# Patient Record
Sex: Female | Born: 1990 | Race: White | Hispanic: No | Marital: Married | Smoking: Never smoker
Health system: Southern US, Community
[De-identification: ages and names within clinical notes are randomized; demographics above are authoritative.]

## PROBLEM LIST (undated history)

## (undated) DIAGNOSIS — R519 Headache, unspecified: Secondary | ICD-10-CM

## (undated) DIAGNOSIS — E785 Hyperlipidemia, unspecified: Secondary | ICD-10-CM

## (undated) DIAGNOSIS — Z87440 Personal history of urinary (tract) infections: Secondary | ICD-10-CM

## (undated) DIAGNOSIS — G90A Postural orthostatic tachycardia syndrome (POTS): Secondary | ICD-10-CM

## (undated) DIAGNOSIS — R51 Headache: Secondary | ICD-10-CM

## (undated) DIAGNOSIS — D649 Anemia, unspecified: Secondary | ICD-10-CM

## (undated) DIAGNOSIS — B019 Varicella without complication: Secondary | ICD-10-CM

## (undated) HISTORY — DX: Personal history of urinary (tract) infections: Z87.440

## (undated) HISTORY — DX: Headache, unspecified: R51.9

## (undated) HISTORY — DX: Hyperlipidemia, unspecified: E78.5

## (undated) HISTORY — DX: Varicella without complication: B01.9

## (undated) HISTORY — PX: OTHER SURGICAL HISTORY: SHX169

## (undated) HISTORY — DX: Postural orthostatic tachycardia syndrome (POTS): G90.A

## (undated) HISTORY — DX: Anemia, unspecified: D64.9

## (undated) HISTORY — DX: Headache: R51

---
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
## 2011-01-02 NOTE — H&P (Signed)
Janice Vance, Janice Vance             ACCOUNT NO.:  [id number]   MEDICAL RECORD NO.:  [id number]          PATIENT TYPE:  INP   LOCATION:  0106                          FACILITY:  [facility]   PHYSICIAN:  Lalla Brothers, MDDATE OF BIRTH:  1991-06-29   DATE OF ADMISSION:  01/06/2007  DATE OF DISCHARGE:                       PSYCHIATRIC ADMISSION ASSESSMENT   IDENTIFICATION:  This 20 year old female, who graduated Dec 26, 2006 from  [employer], an independent home-schooling institution, is admitted  emergently voluntarily in transfer from [facility] Int[facility] Care  Unit by [facility] for inpatient stabilization and treatment of suicide  attempt and depression.  They advised the referring institution that the  family and the patient would likely be ambivalent about cooperating with  care.  They referred the patient voluntarily despite their firsthand  access to the consequences of the patient's overdose reportedly with  #109 Tylenol tablets though father reduces it to approximately 50  tablets by the time of arrival.  Father maintains the patient was simply  stressed by breakup with a boyfriend of 11 months while mother is aware  the patient's nearly lifelong low self-esteem, low confidence and  depressive anxiety.  The patient has been under the outpatient care of  Dr. Tiajuana Amass for Effexor treatment of depression, now being  switched to Lamictal and sees Jerl Mina for psychotherapy at  [facility].   HISTORY OF PRESENT ILLNESS:  The patient will give no details while the  family gradually clarifies that the patient took her overdose in at  least three waves of handfuls of pills while her ex-boyfriend was  standing there observing what he considered stupid behavior.  Boyfriend  would tell her that the pills would not harm her and the patient would  continue to take them as he was leaving to go to his new girlfriend's  house, calling the new girlfriend from  the patient's house.  The  patient's Tylenol level was 192.5 at two hours and 142.3 at four hours,  potentially toxic, being documented to drop to less than 10 as she  received a full course of oral Mucomyst detoxification.  The patient has  been tapering off of Effexor, now at 37.5 mg XR every morning,  apparently over the last two weeks, planning to switch to Lamictal which  the family has already filled at the pharmacy but not started.  Father  finds the patient too susceptible to suggestion and too sensitive to  comments or actions of others.  The patient has always been avoidant and  shy.  The patient has never had manic attributes or features including  currently.  She has been depressed for weeks, reporting a 20-pound  weight loss from lack of eating and from loss of appetite.  Of concern  is that a maternal aunt lost significant weight six months ago after a  breakup with a relationship.  Mother, maternal aunt and maternal  grandmother have all overdosed with mother having antidepressant  medication for anxiety and depression in the past.  Father discounts the  need for any help but subsequently admits that his father was crazy.  The patient  sleeps seven hours a night so that sleep is preserved.  She  has impulse control difficulties.  The patient lies in a fetal position  offering little clarification of symptoms.  She has no alcohol or drug  use.  She has no plans for the future even though she has just graduated  though she does have to face court January 21, 2007 for shoplifting.  The  patient has no organic central nervous system trauma.  The patient and  family are ambivalent about treatment, signing a demand for discharge in  72 hours though with father stating he would relinquish such if the  patient in his opinion definitely needs further treatment.  The patient  and mother will not clarify the confusion evident as father  circumstantially describes the many ways the patient  may feel and think  as well as behave in regard to boyfriend, current stressors, and  relationship problems over time.   PAST MEDICAL HISTORY:  The patient has no primary care physician.  In  the emergency department, she had a right axis deviation on her EKG with  a prominent R waves in the early precordial leads.  She has a history of  mumps and chickenpox in the past.  She reports her usual weight is 130  pounds and she is currently 111.5 pounds, suggesting an 18-20 pounds  weight reduction recently.  Her protime was prolonged at 12.9 with upper  limit of normal 11.2 after the acetaminophen overdose with acetaminophen  levels 192.5 at two hours, 142.3 at four hours, and then documented to  drop to less than 10.  AST was 17, 13, and 13 sequentially while ALT was  21, 18 and then 20.  She had 3+ occult blood in the urinalysis with some  mucous but no other abnormalities.  She is allergic to AMOXICILLIN.  She  acknowledges being sexually active with regular menses with menarche at  age 1.  Her BMI is currently 17.  She has had no seizure or syncope.  She has had no heart murmur or arrhythmia.   REVIEW OF SYSTEMS:  The patient denies difficulty with gait, gaze or  continence.  She denies exposure to communicable disease or toxins.  She  denies rash, jaundice or purpura.  There is no chest pain, palpitations  or presyncope.  There is no abdominal pain, nausea, vomiting or  diarrhea.  There is no diarrhea, dysuria or arthralgia.  There is no  headache or memory loss.  There is no coordination deficit or sensory  loss.   IMMUNIZATIONS:  Up-to-date.   FAMILY HISTORY:  The patient resides with father as parents separated  apparently when the patient was age 54, and have different last names.  The patient has a 53 year old sister and there are half-sisters, ages 29  and 85.  Father reportedly has custody.  The mother is quiet while father is talkative.  Father is somewhat devaluing of  mother's extended  family history but mother can remind father the same about his father.  Paternal grandfather is described as being crazy or having eccentric  character while maternal relatives are described as having depression  and overdoses.  Mother had anxiety and depression and was treated with  medication of antidepressant-type but did have an overdose.  Maternal  aunt and maternal grandmother had overdoses as well with maternal aunt  losing much weight after a breakup similar to the patient.   SOCIAL AND DEVELOPMENTAL HISTORY:  The patient has graduated earlier  this month on Dec 26, 2006 from the [employer].  She has no plans  for the future though she is working two days per week at [employer].  She has had a shoplifting charge to be tried in court January 21, 2007.  The  patient seems fixated in her individuation, identifying with father and  mother in various ways.  The patient does not use alcohol or illicit  drugs that can be determined.  She does have a shoplifting charge.  She  appears to have been oppositional about school attending the [employer] [employer] [facility] as an institution to facilitate home-schooling.  She is sexually  active.   ASSETS:  The patient is relationship oriented.   MENTAL STATUS EXAM:  Height is 172.5 cm and weight is 111.5 pounds.  Blood pressure is 125/86 with heart rate of 135 (sitting) and 140/88  with heart rate of 135 (standing).  She is left-handed.  She is alert  and oriented with speech intact though she offers a paucity of  spontaneous verbal communication.  Cranial nerves 2-12 are intact.  Muscle strengths and tone are normal.  There are no pathologic reflexes  or soft neurologic findings.  There are no abnormal involuntary  movements.  Gait and gaze are intact.  The patient is quiet and avoidant  initially occupying a fetal position.  She has moderate to severe  generalized anxiety with social anxiety features.  She has moderate to  severe  dysphoria and mild to moderate repressed and suppressed anger.  She has some overt oppositionality at times but not in a pervasive way.  She has no manic or psychotic diathesis.  She has no definite post-  traumatic anxiety or dissociation.  There is no organicity.  She has  suicide attempt and anger and despair associated with boyfriend and in  the course of early psychotherapy and medication adjustments.   IMPRESSION:  AXIS I:  Major depression, recurrent, severe with atypical  features.  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  Rule out oppositional defiant disorder (provisional diagnosis).  Parent-  child problem.  Other interpersonal problem.  Other specified family  circumstances.  AXIS II:  Diagnosis deferred.  AXIS III:  Acetaminophen overdose, weight loss at 20 pounds over the  last couple of months, EKG findings of right axis deviation, thin  habitus by history similar to father with usual weight 130 pounds, allergy to AMOXICILLIN.  AXIS IV:  Stressors:  Family--moderate, acute and chronic; peer  relations--extreme, acute and chronic; phase of life--extreme, acute and  chronic.  AXIS V:  GAF on admission 25; highest in last year 70.   PLAN:  The patient is admitted for inpatient adolescent psychiatric and  multidisciplinary multimodal behavioral health treatment in a team-based  program at a locked psychiatric unit.  Nutrition consult was requested.  Remeron or Luvox are considered at bedtime if family is willing.  The  family and the patient are not willing to start medication immediately  and she is just overcoming the overdose.  They also declined Lamictal  pharmacotherapy or follow-up for such.  Cognitive behavioral therapy,  interpersonal therapy, anger management, social and communication skill,  problem-solving and coping skill training, desensitization, graduated  exposure, grief and loss, and individuation separation therapies can be   undertaken.   ESTIMATED LENGTH OF STAY:  Seven days though with family signing a  demand to leave in 72 hours pending the patient's status.  Target  symptoms for discharge include stabilization of suicide  risk and mood,  stabilization of dangerous, disruptive behavior and anxiety and  generalization of the capacity for safe, effective participation in  subsequent outpatient treatment.      Lalla Brothers, MD  Electronically Signed     GEJ/MEDQ  D:  01/07/2007  T:  01/07/2007  Job:  [id number]

## 2011-01-05 NOTE — Discharge Summary (Signed)
NAMEHEIDE, BROSSART             ACCOUNT NO.:  [id number]   MEDICAL RECORD NO.:  [id number]          PATIENT TYPE:  INP   LOCATION:  0106                          FACILITY:  [facility]   PHYSICIAN:  Lalla Brothers, MDDATE OF BIRTH:  February 13, 1991   DATE OF ADMISSION:  01/06/2007  DATE OF DISCHARGE:  01/10/2007                               DISCHARGE SUMMARY   IDENTIFICATION:  A 20 year-old female graduating from [employer] Dec 26, 2006 was admitted emergently voluntarily in  transfer from [facility] Intensive Care Unit for inpatient  stabilization and treatment of suicide risk and depression after the  patient was medically stabilized for suicide attempt with overdose with  109 Tylenol tablets.  Father reduced the number to 50 Tylenol tablets by  the time of arrival by [facility] from [facility] and the patient's  parents signed a parental demand for discharge upon arrival.  Recommendation had been given to [facility] that emergency  petition would be necessary for the family and the patient to be  motivated to mental health care that such was not undertaken.  The  patient was on Effexor treatment for depression recently from Dr. Tiajuana Amass being switched to Lamictal which had not been started.  Effexor taper was down to 37.5 mg XR every morning over the last 2 weeks  and the family had filled the prescription for Lamictal but not started  it.  The patient sees [facility] for therapy.  For full details,  please see the typed admission assessment.   SYNOPSIS OF PRESENT ILLNESS:  The patient has a shoplifting charge to  face in court January 21, 2007.  The patient overdosed with three separate  handfuls of pills in front of her ex-boyfriend who had just called his  new girlfriend from the patient's house and was leaving, having recently  broken up with the patient.  Her boyfriend would minimize the  significance of the patient's overdose  with each handful of pills.  The  patient's Tylenol level was 192 at 2 hours and 142 at 4 hours and she  was therefore treated with Mucomyst detoxification at [facility] Intensive  Care Unit.  The patient has had depression for weeks, reporting a 20-  pound weight loss.  A maternal aunt had significant weight loss 6 months  ago after breakup with a relationship.  Father apparently has custody  and discounts the need for any help for the patient.  The patient  maintains a fetal position offering little clarification of symptoms.  Mother understands the patient's anxiety and depression but the mother  is alienated by father's devaluation and relative threats.  The patient  is allergic to AMOXICILLIN.  Parents separated when the patient was 42  years of age.  Father hopes the patient will get a job.  Mother has been  treated with medications for depression and anxiety in the past and they  suggest that mother, maternal aunt and maternal grandmother have had  overdoses in the past.  Paternal grandfather likely had mental illness  and father has declined help for his anger.  INITIAL MENTAL STATUS EXAM:  The patient is left-handed with an  otherwise intact neurological exam.  She had moderate to severe  generalized anxiety with social anxiety features.  She had moderate to  severe dysphoria with repressed and suppressed anger.  She has overt  oppositionality episodically with no manic or psychotic diathesis  currently.   LABORATORY FINDINGS:  At [facility], CBC was normal with white  count dropping from 5800 to 4800 during treatment, hemoglobin for 14 to  12.7, MCV rising from 84 to 85, MCH unchanged from 29.9 to 29.7 during  the Intensive Care stay, and platelet count 259,000 dropping to 250,000.  Urinalysis was normal except 3+ occult blood with occasional epithelial  and specific gravity of 1.002 with pH 6.  Microscopic exam was otherwise  negative.  Comprehensive metabolic panel on  admission noted CO2 low at  20 and 20 at the time of transfer also.  Random glucose was 111 dropping  to 86 by the time of transfer.  Creatinine was 0.64 dropping to 0.62 by  the time of transfer with normal being 0.7-1.2.  BUN was normal at 8  dropping to 4 and thereby low at the time of transfer.  AST serial  values were 17 and then low at 13 and 13 again.  ALT was normal at 21  dropping to 18 and 20 by the time of discharge/transfer to [facility].  Sodium was normal at 143, potassium 3.6, calcium 9.3, albumin  4.6.  Final Tylenol level was less than 10.  Salicylate was negative as  was alcohol.  Urine drug screen was negative.  Pro time was elevated  12.9 with upper limit of normal 11.2 but PTT was normal at 28 with  reference range 22-35.  Urine pregnancy test was negative.  Electrocardiogram was normal sinus rhythm with rightward axis as a  borderline EKG with rate of 99, PR of 142, QRS of 88 and QTC 439  milliseconds.  The patient was medically treated by the time of transfer  and no further laboratory testing was necessary.   HOSPITAL COURSE AND TREATMENT:  General medical exam by Jorje Guild, PA-C,  noted allergy to AMOXICILLIN.  The patient had menarche at age 2 and  has regular menses.  BMI is 17 and she is tall and thin with habitus  that resembles father's, though she has lost significant weight  recently.  She acknowledges sexual activity and last GYN exam was at  [facility] in the summer of 2007.  She had mumps  and chickenpox as a child.  Admission height was 172.5 cm and weight was  111.5 pounds.  Blood pressure was 125/86 with heart rate of 135 sitting  and 140/88 with heart rate of 135 standing at the time of admission.  At  the time of discharge, mandated by parents, supine blood pressure was  114/66 with heart rate of 78 and standing blood pressure 115/69 with heart rate of 134.  The patient was medically stable through the  hospital  course with resting heart rate serially dropping from 135-104  to 93-78 supine.  Standing heart rate remained 126-146 during the  hospital stay being 134 at discharge.  The patient and parents refused  antidepressant or Lamictal pharmacotherapy during the hospital stay.  Ultimately with her noncompliance, Luvox pharmacotherapy was recommended  for anxiety and depression as there were no hypomanic features evident  throughout the hospital stay.  The patient partially engaged in the  treatment  program and various therapies during the hospital stay.  She  became more verbally capable of participating but was still exhibiting  social anxiety and entitled oppositionality by the time of discharge,  though oppositionality was episodic and predominately surrounding  boyfriend and family issues.  In the final family therapy session,  mother apparently disclosed to father that upcoming court proceedings  would expect more child support for the other children living with  mother.  Father threatened to kill mother if the court expected such.  Every effort was made to help the patient disengage from father's  pattern of chaotic over interpretation and over reaction and to  establish motivated participation in therapies for stabilization of  emotion and behavior.  Mother remained inhibited and father fixated in  doubt about the patient's symptoms.  Treatment was conducted to the  extent that family would allow and the patient's dangerousness mandated  possible without their otherwise ambivalent consent.  They hope that the  patient will establish employment that may help work through social  anxiety and the oppositional compensations for improved mood and  capacity for therapy overall.  The prescription for Luvox was sent with  father and the patient with education to father and mother about  indications, side effects, risks and proper use.  They are ambivalent  about aftercare at the time of discharge  with father acknowledging  appointment with Dr. Tomasa Rand but doubtful about medication or care  other than some therapy to be determined.  The patient required no  seclusion or restraint during hospital stay.   FINAL DIAGNOSES:  AXIS I:  1. Major depression, recurrent, severe with atypical features.  2. 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  3. Rule out oppositional defiant disorder (provisional diagnosis).  4. Parent child problem.  5. Other interpersonal problem.  6. Other specified family circumstances.  AXIS II:  Diagnosis deferred.  AXIS III:  1. Acetaminophen overdose suicide attempt.  2. Weight loss of 10-20 pounds over the last couple of months.  3. Electrocardiogram findings of right axis deviation.  4. Thin habitus similar to father with usual weight 130 pounds.  5. Allergy to amoxicillin.  6. Elevated pro time and metabolic acidosis from overdose. 7. Occult hematuria from menses.  AXIS IV:  Stressors family extreme, acute and chronic; peer relations  extreme, acute and chronic; phase of life extreme, acute and chronic.  AXIS V:  Global assessment of function on admission 25 with highest in  last year estimated at 70 and discharge global assessment of function  was 47.   PLAN:  The patient was discharged to father with mother present and  reviewing discharge immediately after father, after their family therapy  session was interrupted by arguments and father's threats.  The patient  follows a weight gain diet and has no restrictions on physical activity.  Crisis and safety plans are outlined if needed.  Effexor has been  discontinued.  They are provided a prescription for Luvox 100 mg every  bedtime for anxiety and depression as replacement for Effexor if family  and the patient become intent upon solving the problems and therapy  alone is not sufficient, quantity #30 with no refill prescribed.  They  are educated on medication including side  effects, risks and proper use  and FDA guidelines.  They do have an appointment to see Dr. Tomasa Rand  Jan 16, 2007 at 1415 and will schedule with Jerl Mina for therapy  at that time.   Copy to Dr.  Tiajuana Amass at [facility] [address] [place], [place]  [address].      Lalla Brothers, MD  Electronically Signed     GEJ/MEDQ  D:  01/19/2007  T:  01/20/2007  Job:  [id number]   cc:   Tiajuana Amass, M.D.  [facility]  [address]  [place], [place] [address]

## 2012-10-11 ENCOUNTER — Emergency Department: Payer: Self-pay | Admitting: Emergency Medicine

## 2016-05-10 ENCOUNTER — Ambulatory Visit (INDEPENDENT_AMBULATORY_CARE_PROVIDER_SITE_OTHER): Payer: PRIVATE HEALTH INSURANCE | Admitting: Family Medicine

## 2016-05-10 ENCOUNTER — Encounter: Payer: Self-pay | Admitting: Family Medicine

## 2016-05-10 VITALS — BP 95/59 | HR 82 | Temp 98.2°F | Ht 69.5 in | Wt 145.8 lb

## 2016-05-10 DIAGNOSIS — Z3009 Encounter for other general counseling and advice on contraception: Secondary | ICD-10-CM

## 2016-05-10 DIAGNOSIS — R002 Palpitations: Secondary | ICD-10-CM | POA: Diagnosis not present

## 2016-05-10 DIAGNOSIS — R42 Dizziness and giddiness: Secondary | ICD-10-CM

## 2016-05-10 DIAGNOSIS — Z13220 Encounter for screening for lipoid disorders: Secondary | ICD-10-CM | POA: Diagnosis not present

## 2016-05-10 DIAGNOSIS — R519 Headache, unspecified: Secondary | ICD-10-CM

## 2016-05-10 DIAGNOSIS — Z23 Encounter for immunization: Secondary | ICD-10-CM

## 2016-05-10 DIAGNOSIS — R51 Headache: Secondary | ICD-10-CM | POA: Diagnosis not present

## 2016-05-10 LAB — COMPREHENSIVE METABOLIC PANEL
ALK PHOS: 29 U/L — AB (ref 39–117)
ALT: 13 U/L (ref 0–35)
AST: 14 U/L (ref 0–37)
Albumin: 4.4 g/dL (ref 3.5–5.2)
BILIRUBIN TOTAL: 0.7 mg/dL (ref 0.2–1.2)
BUN: 10 mg/dL (ref 6–23)
CO2: 30 mEq/L (ref 19–32)
Calcium: 9.4 mg/dL (ref 8.4–10.5)
Chloride: 104 mEq/L (ref 96–112)
Creatinine, Ser: 0.67 mg/dL (ref 0.40–1.20)
GFR: 112.49 mL/min (ref >60.00)
GLUCOSE: 88 mg/dL (ref 70–99)
Potassium: 3.9 mEq/L (ref 3.5–5.1)
SODIUM: 139 meq/L (ref 135–145)
TOTAL PROTEIN: 7.2 g/dL (ref 6.0–8.3)

## 2016-05-10 LAB — TSH: TSH: 1.73 u[IU]/mL (ref 0.35–4.50)

## 2016-05-10 LAB — LIPID PANEL
Cholesterol: 158 mg/dL (ref 0–200)
HDL: 40 mg/dL (ref >39.00)
LDL CALC: 101 mg/dL — AB (ref 0–99)
NonHDL: 117.5
Total CHOL/HDL Ratio: 4
Triglycerides: 82 mg/dL (ref 0.0–149.0)
VLDL: 16.4 mg/dL (ref 0.0–40.0)

## 2016-05-10 LAB — VITAMIN B12: Vitamin B-12: 239 pg/mL (ref 211–911)

## 2016-05-10 LAB — POCT URINE PREGNANCY: Preg Test, Ur: NEGATIVE

## 2016-05-10 LAB — HEMOGLOBIN A1C: HEMOGLOBIN A1C: 5.1 % (ref 4.6–6.5)

## 2016-05-10 MED ORDER — LEVONORGESTREL-ETHINYL ESTRAD 0.1-20 MG-MCG PO TABS: 1.0000 | ORAL_TABLET | Freq: Every day | ORAL | 11 refills | Status: DC

## 2016-05-10 NOTE — Progress Notes (Addendum)
[facility] [address], [address] [place], [place] [address] [phone]  Date:  05/10/2016   Name:  Janice Vance   DOB:  04-Mar-1991   MRN:  [id number]  PCP:  Abbe Amsterdam, MD    Chief Complaint: Establish Care (Pt here to est care. c/o getting several headaches each week that has been worse over the past month and occ dizziness first thing in the morning. Also noticed increased heart rate that comes and goes x 2-3 months. Would like to discuss starting birth control pills. Will get flu vaccine today. )   History of Present Illness:  Janice Vance is a 25 y.o. very pleasant female patient who presents with the following:  Establishing care today- her PCP "closed down" so she needed to find a new MD. She does not see OBG at this time either Here today as a new patient with concern of "really really bad headaches."  She went to the eye doctor and all was well She has noted the headaches for about 2 months.  She may get one every 3 days or so.  She rarely takes medication  For her HA but will sometimes use ibuprofen.   She does not generally have the HA when she wakes up in the morning.  The HA generally start in the afternoon/evening and will be in the back of her head No nausea or vomiting with HA.  She will notice sensitivity to smells, but not to lights or sounds.   She will notice the pain more in the back of her head No aura.    In the morning when she first gets out of bed, she may feel lightheaded. She has noted this for 2-3 months. This is present 1-2x a week.  Generally once she gets going this will clear up No syncope.    Also she will sometimes feel that her heart speeds up when she lays on her side in the evening.  She has noted this for a month or so. When she turns onto her back it goes back to normal.  She will have some SOB with exercise, but she thinks she is just out of shape. No CP  She was told  that her cholesterol was high a few months ago- reports that she stopped drinking sodas, has tried to eat healthier foods and has lost about 4 lbs since this dx.  No cholesterol meds however  She did eat this am She is interested in OCP, she has used depo in the past.    Last intercourse about 3 months ago.  No history of HTN, cancer, stroke, DVT or PE, or migraine with aura.  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  She enjoys riding horses in her free time  LMP 9/15  There are no active problems to display for this patient.   Past Medical History:  Diagnosis Date  . Chicken pox    as a child  . Frequent headaches   . History of UTI   . Hyperlipemia     No past surgical history on file.  Social History  Substance Use Topics  . Smoking status: Not on file  . Smokeless tobacco: Not on file  . Alcohol use Not on file    Family History  Problem Relation Age of Onset  . Breast cancer Father   . Diabetes Father  Allergies  Allergen Reactions  . Codeine Rash  . Penicillins Rash    Medication list has been reviewed and updated.  No current outpatient prescriptions on file prior to visit.   No current facility-administered medications on file prior to visit.     Review of Systems:  As per HPI- otherwise negative.   Physical Examination: Vitals:   05/10/16 1053  BP: (!) 95/59  Pulse: 82  Temp: 98.2 F (36.8 C)   Vitals:   05/10/16 1053  Weight: 145 lb 12.8 oz (66.1 kg)  Height: 5' 9.5" (1.765 m)   Body mass index is 21.22 kg/m. Ideal Body Weight: Weight in (lb) to have BMI = 25: 171.4  GEN: WDWN, NAD, Non-toxic, A & O x 3, slim build, looks well HEENT: Atraumatic, Normocephalic. Neck supple. No masses, No LAD.  Bilateral TM wnl, oropharynx normal.  PEERL,EOMI.   Ears and Nose: No external deformity. CV: RRR, No M/G/R. No JVD. No thrill. No extra heart sounds. PULM: CTA B, no wheezes, crackles,  rhonchi. No retractions. No resp. distress. No accessory muscle use. ABD: S, NT, ND, +BS. No rebound. No HSM. EXTR: No c/c/e NEURO Normal gait.  Normal strength and DTR of all extremities  PSYCH: Normally interactive. Conversant. Not depressed or anxious appearing.  Calm demeanor.   EKG: NSR, she does have a negative T in V2 but this is likely normal.  No old tracings for comparison  Orthostatic VS for the past 24 hrs:  BP- Lying Pulse- Lying BP- Sitting Pulse- Sitting BP- Standing at 0 minutes Pulse- Standing at 0 minutes  05/10/16 1146 101/71 70 105/73 72 104/75 101    Results for orders placed or performed in visit on 05/10/16  POCT urine pregnancy  Result Value Ref Range   Preg Test, Ur Negative Negative   Assessment and Plan: Lightheaded - Plan: EKG 12-Lead, Comprehensive metabolic panel, TSH, Hemoglobin A1c, B12  Frequent headaches  Encounter for other general counseling or advice on contraception - Plan: POCT urine pregnancy, levonorgestrel-ethinyl estradiol (AVIANE,ALESSE,LESSINA) 0.1-20 MG-MCG tablet  Palpitations - Plan: EKG 12-Lead  Screening for hyperlipidemia - Plan: Lipid panel  Here today with a few concerns. She has noted lightheadedness in the morning and does have a pulse increase on orthostatic testing. Will check labs as above, encouraged her to increase fluid and salt intake. If labs are normal and sx persist will refer to cardiology Her HA and palpitations may be related to same. Counseled her that some women will have worsening of HA with OCP, but this does not mean she cannot try them. Discussed OCP use in detail with her today  Will plan further follow- up pending labs.  Pt gives permission for me to discuss with her sister Janice Vance who works in health care Signed Abbe Amsterdam, MD  Received her labs, gave her a call 9/22. So far labs look good.  She will keep me posted about her progress Called pt 9/25- I had added on a CBC but then found out it could  not be added. She will come in for a blood draw tomorrow am. Apologized for inconvenience   Results for orders placed or performed in visit on 05/10/16  Comprehensive metabolic panel  Result Value Ref Range   Sodium 139 135 - 145 mEq/L   Potassium 3.9 3.5 - 5.1 mEq/L   Chloride 104 96 - 112 mEq/L   CO2 30 19 - 32 mEq/L   Glucose, Bld 88 70 - 99 mg/dL   BUN  10 6 - 23 mg/dL   Creatinine, Ser 1.610.67 0.40 - 1.20 mg/dL   Total Bilirubin 0.7 0.2 - 1.2 mg/dL   Alkaline Phosphatase 29 (L) 39 - 117 U/L   AST 14 0 - 37 U/L   ALT 13 0 - 35 U/L   Total Protein 7.2 6.0 - 8.3 g/dL   Albumin 4.4 3.5 - 5.2 g/dL   Calcium 9.4 8.4 - 09.610.5 mg/dL   GFR 045.40112.49 >98.11>60.00 mL/min  TSH  Result Value Ref Range   TSH 1.73 0.35 - 4.50 uIU/mL  Lipid panel  Result Value Ref Range   Cholesterol 158 0 - 200 mg/dL   Triglycerides 91.482.0 0.0 - 149.0 mg/dL   HDL 78.2940.00 >56.21>39.00 mg/dL   VLDL 30.816.4 0.0 - 65.740.0 mg/dL   LDL Cholesterol 846101 (H) 0 - 99 mg/dL   Total CHOL/HDL Ratio 4    NonHDL 117.50   Hemoglobin A1c  Result Value Ref Range   Hgb A1c MFr Bld 5.1 4.6 - 6.5 %  B12  Result Value Ref Range   Vitamin B-12 239 211 - 911 pg/mL  POCT urine pregnancy  Result Value Ref Range   Preg Test, Ur Negative Negative   Received CBC also- gave her a call 10/1.  LMOM- labs essentially normal, if sx persist will plan cardiology referral.  Please let me know if not better and I will send her a copy of her labs   Lab Results  Component Value Date   WBC 3.9 (L) 05/15/2016   HGB 11.3 (L) 05/15/2016   HCT 32.9 (L) 05/15/2016   MCV 86.0 05/15/2016   PLT 233.0 05/15/2016

## 2016-05-10 NOTE — Patient Instructions (Signed)
It was very nice to meet you today We will try to find out why you are feeling dizzy!  As we discussed, your blood pressure did not drop when you went from laying down to standing, but your pulse did increase by 30 beats per minute. This suggests something called orthostatic hypotension, which can be caused by many different things.  We are doing labs for you and I will be in touch with your results asap.  In the meantime, try to increase your fluid intake.  Drinking gatorade and increasing your dietary salt intake can also help.  When you get up in the morning, sit on the side of the bed for a minute or two prior to standing up.    You had your flu shot today  You may start on your birth control pill now, or another day this week if you prefer.  Take the pill once daily, at around the same time each day.  If your headaches get worse with the pills please let me know and we can try something else for you. Some women- but certainly not all-  have worsening of headaches on birth control pills. Remember birth control pills do not prevent STI so use condoms if any risk of infection.    Please let me know if you are getting worse or have any change in your symptoms.

## 2016-05-14 NOTE — Addendum Note (Signed)
Addended by: Abbe AmsterdamOPLAND, Harshita Bernales C on: 05/14/2016 05:52 PM   Modules accepted: Orders

## 2016-05-15 ENCOUNTER — Other Ambulatory Visit (INDEPENDENT_AMBULATORY_CARE_PROVIDER_SITE_OTHER): Payer: PRIVATE HEALTH INSURANCE

## 2016-05-15 DIAGNOSIS — R42 Dizziness and giddiness: Secondary | ICD-10-CM

## 2016-05-15 LAB — CBC
HCT: 32.9 % — ABNORMAL LOW (ref 36.0–46.0)
Hemoglobin: 11.3 g/dL — ABNORMAL LOW (ref 12.0–15.0)
MCHC: 34.3 g/dL (ref 30.0–36.0)
MCV: 86 fl (ref 78.0–100.0)
Platelets: 233 10*3/uL (ref 150.0–400.0)
RBC: 3.83 Mil/uL — ABNORMAL LOW (ref 3.87–5.11)
RDW: 13.9 % (ref 11.5–15.5)
WBC: 3.9 10*3/uL — ABNORMAL LOW (ref 4.0–10.5)

## 2016-05-20 ENCOUNTER — Encounter: Payer: Self-pay | Admitting: Family Medicine

## 2016-06-05 ENCOUNTER — Telehealth: Payer: Self-pay | Admitting: Family Medicine

## 2016-06-05 DIAGNOSIS — R42 Dizziness and giddiness: Secondary | ICD-10-CM

## 2016-06-05 DIAGNOSIS — R002 Palpitations: Secondary | ICD-10-CM

## 2016-06-05 NOTE — Telephone Encounter (Signed)
Caller name: Relationship to patient: Self Can be reached: [phone] Pharmacy:  Reason for call: States she is having the same symptoms and would like a referral to a cardiologist

## 2016-06-06 NOTE — Telephone Encounter (Signed)
Called her- she reports that her sx of lightheadedness continue and she would like to see cards Will arrange for her

## 2016-06-11 ENCOUNTER — Encounter: Payer: Self-pay | Admitting: Cardiology

## 2016-06-12 NOTE — Progress Notes (Signed)
Electrophysiology Office Note   Date:  06/13/2016   ID:  Janice, Vance 07-23-1991, MRN [id number]  PCP:  Abbe Amsterdam, MD  Cardiologist:   Regan Lemming, MD    Chief Complaint  Patient presents with  . Advice Only    palpitations/lightheadedness/sob     History of Present Illness: Janice Vance is a 25 y.o. female who presents today for electrophysiology evaluation.   Has been feeling palpitations for the last few months.  Occurs when lays on her side, improved when on her back. She says her episodes occur roughly every other day. She says that there are no exacerbating or alleviating factors. He also says that usually the episodes last a few seconds. She says when she lays on her right side they are worse when she rolls over and they improve.   Today, she denies symptoms of palpitations, chest pain, shortness of breath, orthopnea, PND, lower extremity edema, claudication, dizziness, presyncope, syncope, bleeding, or neurologic sequela. The patient is tolerating medications without difficulties and is otherwise without complaint today.    Past Medical History:  Diagnosis Date  . Chicken pox    as a child  . Frequent headaches   . History of UTI   . Hyperlipemia    No past surgical history on file.   Current Outpatient Prescriptions  Medication Sig Dispense Refill  . levonorgestrel-ethinyl estradiol (AVIANE,ALESSE,LESSINA) 0.1-20 MG-MCG tablet Take 1 tablet by mouth daily. 1 Package 11   No current facility-administered medications for this visit.     Allergies:   Codeine and Penicillins   Social History:  The patient  reports that she has never smoked. She has never used smokeless tobacco. She reports that she does not drink alcohol or use drugs.   Family History:  The patient's family history includes Breast cancer in her father; Diabetes in her father; Heart attack in her paternal grandfather and paternal uncle; Heart failure in her  maternal grandmother; Supraventricular tachycardia in her maternal grandmother and paternal grandmother.    ROS:  Please see the history of present illness.   Otherwise, review of systems is positive for weight change, palpitations, SOB, dizziness, headache.   All other systems are reviewed and negative.    PHYSICAL EXAM: VS:  BP 110/90 (BP Location: Right Arm)   Pulse 76   Ht 5\' 9"  (1.753 m)   Wt 144 lb (65.3 kg)   BMI 21.27 kg/m  , BMI Body mass index is 21.27 kg/m. GEN: Well nourished, well developed, in no acute distress  HEENT: normal  Neck: no JVD, carotid bruits, or masses Cardiac: RRR; no murmurs, rubs, or gallops,no edema  Respiratory:  clear to auscultation bilaterally, normal work of breathing GI: soft, nontender, nondistended, + BS MS: no deformity or atrophy  Skin: warm and dry Neuro:  Strength and sensation are intact Psych: euthymic mood, full affect  EKG:  EKG is ordered today. Personal review of the ekg ordered shows sinus rhythm, rate 76  Recent Labs: 05/10/2016: ALT 13; BUN 10; Creatinine, Ser 0.67; Potassium 3.9; Sodium 139; TSH 1.73 05/15/2016: Hemoglobin 11.3; Platelets 233.0    Lipid Panel     Component Value Date/Time   CHOL 158 05/10/2016 1151   TRIG 82.0 05/10/2016 1151   HDL 40.00 05/10/2016 1151   CHOLHDL 4 05/10/2016 1151   VLDL 16.4 05/10/2016 1151   LDLCALC 101 (H) 05/10/2016 1151     Wt Readings from Last 3 Encounters:  06/13/16 144 lb (65.3 kg)  05/10/16 145 lb 12.8 oz (66.1 kg)      Other studies Reviewed: Additional studies/ records that were reviewed today include: Epic notes  ASSESSMENT AND PLAN:  1.  Palpitations: At this time it is difficult to say what is the cause of her palpitations. It is possible that they are benign either APCs or PVCs, but due to her history of tachycardia and palpitations, we'll fit her with a 30 day monitor. Her EKG is normal today and the skin was no hint as to the cause of her symptoms.  2.  Shortness of breath: She does say that she gets shortness of breath when she exercises. She says that she would be short of breath when walking to her car. She does not have any signs or symptoms of heart failure on exam, with no swelling, JVD, or crackles in her lungs. It is unlikely that her shortness of breath is due to a cardiac cause.  Current medicines are reviewed at length with the patient today.   The patient does not have concerns regarding her medicines.  The following changes were made today:  none  Labs/ tests ordered today include:  Orders Placed This Encounter  Procedures  . Cardiac event monitor  . EKG 12-Lead     Disposition:   FU with Janine Reller pending monitor   Signed, Welma Mccombs Jorja LoaMartin Tiras Bianchini, MD  06/13/2016 3:32 PM     [facility] [address] [place] [address] [phone] (office) [phone] (fax)

## 2016-06-13 ENCOUNTER — Encounter (INDEPENDENT_AMBULATORY_CARE_PROVIDER_SITE_OTHER): Payer: Self-pay

## 2016-06-13 ENCOUNTER — Encounter: Payer: Self-pay | Admitting: Cardiology

## 2016-06-13 ENCOUNTER — Ambulatory Visit (INDEPENDENT_AMBULATORY_CARE_PROVIDER_SITE_OTHER): Payer: PRIVATE HEALTH INSURANCE

## 2016-06-13 ENCOUNTER — Ambulatory Visit (INDEPENDENT_AMBULATORY_CARE_PROVIDER_SITE_OTHER): Payer: PRIVATE HEALTH INSURANCE | Admitting: Cardiology

## 2016-06-13 VITALS — BP 110/90 | HR 76 | Ht 69.0 in | Wt 144.0 lb

## 2016-06-13 DIAGNOSIS — R002 Palpitations: Secondary | ICD-10-CM

## 2016-06-13 NOTE — Patient Instructions (Signed)
Medication Instructions: No Change  Labwork: None Ordered  Procedures/Testing: Your physician has recommended that you wear an event monitor. Event monitors are medical devices that record the heart's electrical activity. Doctors most often us these monitors to diagnose arrhythmias. Arrhythmias are problems with the speed or rhythm of the heartbeat. The monitor is a small, portable device. You can wear one while you do your normal daily activities. This is usually used to diagnose what is causing palpitations/syncope (passing out).    Follow-Up:  To be determined after physician has reviewed event monitor results.   Any Additional Special Instructions Will Be Listed Below (If Applicable).  Cardiac Event Monitoring A cardiac event monitor is a small recording device used to help detect abnormal heart rhythms (arrhythmias). The monitor is used to record heart rhythm when noticeable symptoms such as the following occur:  Fast heartbeats (palpitations), such as heart racing or fluttering.  Dizziness.  Fainting or light-headedness.  Unexplained weakness. The monitor is wired to two electrodes placed on your chest. Electrodes are flat, sticky disks that attach to your skin. The monitor can be worn for up to 30 days. You will wear the monitor at all times, except when bathing.  HOW TO USE YOUR CARDIAC EVENT MONITOR A technician will prepare your chest for the electrode placement. The technician will show you how to place the electrodes, how to work the monitor, and how to replace the batteries. Take time to practice using the monitor before you leave the office. Make sure you understand how to send the information from the monitor to your health care provider. This requires a telephone with a landline, not a cell phone. You need to:  Wear your monitor at all times, except when you are in water:  Do not get the monitor wet.  Take the monitor off when bathing. Do not swim or use a hot tub  with it on.  Keep your skin clean. Do not put body lotion or moisturizer on your chest.  Change the electrodes daily or any time they stop sticking to your skin. You might need to use tape to keep them on.  It is possible that your skin under the electrodes could become irritated. To keep this from happening, try to put the electrodes in slightly different places on your chest. However, they must remain in the area under your left breast and in the upper right section of your chest.  Make sure the monitor is safely clipped to your clothing or in a location close to your body that your health care provider recommends.  Press the button to record when you feel symptoms of heart trouble, such as dizziness, weakness, light-headedness, palpitations, thumping, shortness of breath, unexplained weakness, or a fluttering or racing heart. The monitor is always on and records what happened slightly before you pressed the button, so do not worry about being too late to get good information.  Keep a diary of your activities, such as walking, doing chores, and taking medicine. It is especially important to note what you were doing when you pushed the button to record your symptoms. This will help your health care provider determine what might be contributing to your symptoms. The information stored in your monitor will be reviewed by your health care provider alongside your diary entries.  Send the recorded information as recommended by your health care provider. It is important to understand that it will take some time for your health care provider to process the results.  Change  the batteries as recommended by your health care provider. SEEK IMMEDIATE MEDICAL CARE IF:   You have chest pain.  You have extreme difficulty breathing or shortness of breath.  You develop a very fast heartbeat that persists.  You develop dizziness that does not go away.  You faint or constantly feel you are about to faint.    This information is not intended to replace advice given to you by your health care provider. Make sure you discuss any questions you have with your health care provider.   Document Released: 05/15/2008 Document Revised: 08/27/2014 Document Reviewed: 02/02/2013 Elsevier Interactive Patient Education [employer]

## 2016-07-13 DIAGNOSIS — R002 Palpitations: Secondary | ICD-10-CM

## 2016-07-23 ENCOUNTER — Ambulatory Visit (INDEPENDENT_AMBULATORY_CARE_PROVIDER_SITE_OTHER): Payer: PRIVATE HEALTH INSURANCE | Admitting: Cardiology

## 2016-07-23 ENCOUNTER — Encounter (INDEPENDENT_AMBULATORY_CARE_PROVIDER_SITE_OTHER): Payer: Self-pay

## 2016-07-23 ENCOUNTER — Encounter: Payer: Self-pay | Admitting: Cardiology

## 2016-07-23 VITALS — BP 112/68 | HR 76 | Ht 70.0 in | Wt 150.0 lb

## 2016-07-23 DIAGNOSIS — I951 Orthostatic hypotension: Secondary | ICD-10-CM

## 2016-07-23 DIAGNOSIS — G90A Postural orthostatic tachycardia syndrome (POTS): Secondary | ICD-10-CM

## 2016-07-23 DIAGNOSIS — R0602 Shortness of breath: Secondary | ICD-10-CM

## 2016-07-23 DIAGNOSIS — R000 Tachycardia, unspecified: Secondary | ICD-10-CM | POA: Diagnosis not present

## 2016-07-23 DIAGNOSIS — I498 Other specified cardiac arrhythmias: Secondary | ICD-10-CM

## 2016-07-23 DIAGNOSIS — I471 Supraventricular tachycardia: Secondary | ICD-10-CM | POA: Diagnosis not present

## 2016-07-23 MED ORDER — METOPROLOL TARTRATE 25 MG PO TABS: 25.0000 mg | ORAL_TABLET | Freq: Two times a day (BID) | ORAL | 6 refills | Status: DC

## 2016-07-23 NOTE — Progress Notes (Signed)
Electrophysiology Office Note   Date:  07/23/2016   ID:  Janice Vance, DOB [birth year], MRN [id number]  PCP:  Abbe AmsterdamOPLAND,JESSICA, MD  Cardiologist:   Regan LemmingWill Martin Jameyah Fennewald, MD    Chief Complaint  Patient presents with  . Follow-up    Palpitations     History of Present Illness: Pryor CuriaMegan Leigh Vance is a 25 y.o. female who presents today for electrophysiology evaluation.   Has been feeling palpitations for the last few months.  She wore a 30 day monitor that showed evidence of tachycardia up to 130 beats minute when sitting. She says that her symptoms usually happen when she is changing positions. When her heart rate is fast, she does feel dizzy. She also continues to be short of breath. She says that she is short of breath with minor amounts of walking. She also gets short of breath when talking on the phone for long periods of time. She does not have any PND or orthopnea symptoms.   Today, she denies symptoms of chest pain, shortness of breath, orthopnea, PND, lower extremity edema, claudication, presyncope, syncope, bleeding, or neurologic sequela. The patient is tolerating medications without difficulties and is otherwise without complaint today.    Past Medical History:  Diagnosis Date  . Chicken pox    as a child  . Frequent headaches   . History of UTI   . Hyperlipemia    No past surgical history on file.   Current Outpatient Prescriptions  Medication Sig Dispense Refill  . levonorgestrel-ethinyl estradiol (AVIANE,ALESSE,LESSINA) 0.1-20 MG-MCG tablet Take 1 tablet by mouth daily. 1 Package 11  . metoprolol tartrate (LOPRESSOR) 25 MG tablet Take 1 tablet (25 mg total) by mouth 2 (two) times daily. 60 tablet 6   No current facility-administered medications for this visit.     Allergies:   Codeine and Penicillins   Social History:  The patient  reports that she has never smoked. She has never used smokeless tobacco. She reports that she does not drink alcohol or  use drugs.   Family History:  The patient's family history includes Breast cancer in her father; Diabetes in her father; Heart attack in her paternal grandfather and paternal uncle; Heart failure in her maternal grandmother; Supraventricular tachycardia in her maternal grandmother and paternal grandmother.    ROS:  Please see the history of present illness.   Otherwise, review of systems is positive for dizziness, headaches.   All other systems are reviewed and negative.    PHYSICAL EXAM: VS:  BP 112/68   Pulse 76   Ht 5\' 10"  (1.778 m)   Wt 150 lb (68 kg)   BMI 21.52 kg/m  , BMI Body mass index is 21.52 kg/m. GEN: Well nourished, well developed, in no acute distress  HEENT: normal  Neck: no JVD, carotid bruits, or masses Cardiac: RRR; no murmurs, rubs, or gallops,no edema  Respiratory:  clear to auscultation bilaterally, normal work of breathing GI: soft, nontender, nondistended, + BS MS: no deformity or atrophy  Skin: warm and dry Neuro:  Strength and sensation are intact Psych: euthymic mood, full affect  EKG:  EKG is ordered today. Personal review of the ekg ordered 06/13/16 shows sinus rhythm, rate 76  Recent Labs: 05/10/2016: ALT 13; BUN 10; Creatinine, Ser 0.67; Potassium 3.9; Sodium 139; TSH 1.73 05/15/2016: Hemoglobin 11.3; Platelets 233.0    Lipid Panel     Component Value Date/Time   CHOL 158 05/10/2016 1151   TRIG 82.0 05/10/2016 1151   HDL  40.00 05/10/2016 1151   CHOLHDL 4 05/10/2016 1151   VLDL 16.4 05/10/2016 1151   LDLCALC 101 (H) 05/10/2016 1151     Wt Readings from Last 3 Encounters:  07/23/16 150 lb (68 kg)  06/13/16 144 lb (65.3 kg)  05/10/16 145 lb 12.8 oz (66.1 kg)      Other studies Reviewed: Additional studies/ records that were reviewed today include: 30 day monitor The high average heart rate seen for the monitored period was 130 BPM. The low average heart rate seen for the monitored period was 70 BPM. Symptoms of dizziness and  lightheadedness associated with sinus rhythm and sinus tachycardia  ASSESSMENT AND PLAN:  1.  POTS: monitor showed no evidence of arrhythmia, but she was noted to be tachycardic of 230 bpm when sitting. It is possible this is some autonomic dysfunction including pots. I have discussed with her the importance of increasing hydration. I also discussed with her the possibility of starting her on metoprolol 25 mg twice a day to see if this Raedyn Wenke help with her symptoms of palpitations. We Avanell Banwart get an echo as well to further determine her cardiac function.  2. Shortness of breath: She does continue to have shortness of breath with exertion, as well as at rest at times. She does not have shortness of breath when lying flat. We'll get an echocardiogram to rule out cardiac causes.  Current medicines are reviewed at length with the patient today.   The patient does not have concerns regarding her medicines.  The following changes were made today:  Metoprolol  Labs/ tests ordered today include:  Orders Placed This Encounter  Procedures  . ECHOCARDIOGRAM COMPLETE     Disposition:   FU with Jayci Ellefson 6 months  Signed, Babacar Haycraft Jorja LoaMartin Aila Terra, MD  07/23/2016 3:06 PM     [facility] [address] [place] [address] [phone] (office) [phone] (fax)

## 2016-07-23 NOTE — Patient Instructions (Signed)
Medication Instructions:   Your physician has recommended you make the following change in your medication:   1) START Metoprolol tartrate 25 mg twice a day  --- If you need a refill on your cardiac medications before your next appointment, please call your pharmacy. ---  Labwork:  None ordered  Testing/Procedures: Your physician has requested that you have an echocardiogram. Echocardiography is a painless test that uses sound waves to create images of your heart. It provides your doctor with information about the size and shape of your heart and how well your heart's chambers and valves are working. This procedure takes approximately one hour. There are no restrictions for this procedure.  Follow-Up:  Your physician wants you to follow-up in: 6 months with Dr. Elberta Fortisamnitz.  You will receive a reminder letter in the mail two months in advance. If you don't receive a letter, please call our office to schedule the follow-up appointment.  Thank you for choosing [facility]!!   Dory HornSherri Evangelynn Lochridge, RN [phone]   Any Other Special Instructions Will Be Listed Below (If Applicable). POTS resources: [email or web address] Potsplace.com    Metoprolol tablets What is this medicine? METOPROLOL (me TOE proe lole) is a beta-blocker. Beta-blockers reduce the workload on the heart and help it to beat more regularly. This medicine is used to treat high blood pressure and to prevent chest pain. It is also used to after a heart attack and to prevent an additional heart attack from occurring. This medicine may be used for other purposes; ask your health care provider or pharmacist if you have questions. COMMON BRAND NAME(S): Lopressor What should I tell my health care provider before I take this medicine? They need to know if you have any of these conditions: -diabetes -heart or vessel disease like slow heart rate, worsening heart failure, heart block, sick sinus syndrome or Raynaud's disease -kidney  disease -liver disease -lung or breathing disease, like asthma or emphysema -pheochromocytoma -thyroid disease -an unusual or allergic reaction to metoprolol, other beta-blockers, medicines, foods, dyes, or preservatives -pregnant or trying to get pregnant -breast-feeding How should I use this medicine? Take this medicine by mouth with a drink of water. Follow the directions on the prescription label. Take this medicine immediately after meals. Take your doses at regular intervals. Do not take more medicine than directed. Do not stop taking this medicine suddenly. This could lead to serious heart-related effects. Talk to your pediatrician regarding the use of this medicine in children. Special care may be needed. Overdosage: If you think you have taken too much of this medicine contact a poison control center or emergency room at once. NOTE: This medicine is only for you. Do not share this medicine with others. What if I miss a dose? If you miss a dose, take it as soon as you can. If it is almost time for your next dose, take only that dose. Do not take double or extra doses. What may interact with this medicine? This medicine may interact with the following medications: -certain medicines for blood pressure, heart disease, irregular heart beat -certain medicines for depression like monoamine oxidase (MAO) inhibitors, fluoxetine, or paroxetine -clonidine -dobutamine -epinephrine -isoproterenol -reserpine This list may not describe all possible interactions. Give your health care provider a list of all the medicines, herbs, non-prescription drugs, or dietary supplements you use. Also tell them if you smoke, drink alcohol, or use illegal drugs. Some items may interact with your medicine. What should I watch for while using this medicine? Visit  your doctor or health care professional for regular check ups. Contact your doctor right away if your symptoms worsen. Check your blood pressure and  pulse rate regularly. Ask your health care professional what your blood pressure and pulse rate should be, and when you should contact them. You may get drowsy or dizzy. Do not drive, use machinery, or do anything that needs mental alertness until you know how this medicine affects you. Do not sit or stand up quickly, especially if you are an older patient. This reduces the risk of dizzy or fainting spells. Contact your doctor if these symptoms continue. Alcohol may interfere with the effect of this medicine. Avoid alcoholic drinks. What side effects may I notice from receiving this medicine? Side effects that you should report to your doctor or health care professional as soon as possible: -allergic reactions like skin rash, itching or hives -cold or numb hands or feet -depression -difficulty breathing -faint -fever with sore throat -irregular heartbeat, chest pain -rapid weight gain -swollen legs or ankles Side effects that usually do not require medical attention (report to your doctor or health care professional if they continue or are bothersome): -anxiety or nervousness -change in sex drive or performance -dry skin -headache -nightmares or trouble sleeping -short term memory loss -stomach upset or diarrhea -unusually tired This list may not describe all possible side effects. Call your doctor for medical advice about side effects. You may report side effects to FDA at 1-800-FDA-1088. Where should I keep my medicine? Keep out of the reach of children. Store at room temperature between 15 and 30 degrees C (59 and 86 degrees F). Throw away any unused medicine after the expiration date. NOTE: This sheet is a summary. It may not cover all possible information. If you have questions about this medicine, talk to your doctor, pharmacist, or health care provider.  2017 Elsevier/Gold Standard (2013-04-10 14:40:36)

## 2016-08-14 ENCOUNTER — Other Ambulatory Visit: Payer: Self-pay

## 2016-08-14 ENCOUNTER — Ambulatory Visit (HOSPITAL_COMMUNITY): Payer: No Typology Code available for payment source | Attending: Internal Medicine

## 2016-08-14 DIAGNOSIS — I471 Supraventricular tachycardia: Secondary | ICD-10-CM | POA: Insufficient documentation

## 2016-08-14 DIAGNOSIS — R0602 Shortness of breath: Secondary | ICD-10-CM

## 2016-12-24 ENCOUNTER — Ambulatory Visit (HOSPITAL_BASED_OUTPATIENT_CLINIC_OR_DEPARTMENT_OTHER)
Admission: RE | Admit: 2016-12-24 | Discharge: 2016-12-24 | Disposition: A | Discharge status: Home / Self Care | Payer: PRIVATE HEALTH INSURANCE | Source: Ambulatory Visit | Attending: Family Medicine | Admitting: Family Medicine

## 2016-12-24 ENCOUNTER — Ambulatory Visit (INDEPENDENT_AMBULATORY_CARE_PROVIDER_SITE_OTHER): Payer: PRIVATE HEALTH INSURANCE | Admitting: Family Medicine

## 2016-12-24 ENCOUNTER — Encounter: Payer: Self-pay | Admitting: Family Medicine

## 2016-12-24 VITALS — BP 110/70 | HR 83 | Temp 98.0°F | Ht 70.0 in | Wt 152.2 lb

## 2016-12-24 DIAGNOSIS — R000 Tachycardia, unspecified: Secondary | ICD-10-CM | POA: Diagnosis not present

## 2016-12-24 DIAGNOSIS — R0602 Shortness of breath: Secondary | ICD-10-CM | POA: Diagnosis not present

## 2016-12-24 MED ORDER — ALPRAZOLAM 0.25 MG PO TABS: 0.2500 mg | ORAL_TABLET | Freq: Two times a day (BID) | ORAL | 0 refills | Status: DC | PRN

## 2016-12-24 MED ORDER — PROPRANOLOL HCL 10 MG PO TABS: ORAL_TABLET | ORAL | 2 refills | Status: DC

## 2016-12-24 NOTE — Progress Notes (Signed)
152.2lbs

## 2016-12-24 NOTE — Progress Notes (Signed)
[facility] [place] [address], [address] [place], [place] [address] [phone] [phone]  Date:  12/24/2016   Name:  Janice CuriaMegan Leigh Longino   DOB:  [birth year]   MRN:  [id number]  PCP:  Pearline Cablesopland, Jessica C, MD    Chief Complaint: Shortness of Breath (c/o still having SOB since last visit. Pt states that sx's happen all of a sudden which makes her panic which makes sx's worse. Pt did see cardiologist who prescribed  Metoprolol, pt only took for one week but stopped due to med making her feel tired. )   History of Present Illness:  Janice Vance is a 26 y.o. very pleasant female patient who presents with the following:  Seen by myself last September with concern of feeling lightheaded and palpitations/ tachycardia-  She did see Dr. Elberta Fortisamnitz who dx with with likely POTS - she did a cardiac event monitor which did show tachycardia to approx 130 at rest. She also had an echo which was benign Dr. Elberta Fortisamnitz suggested metoprolol to help control her sx of tachycardia. However she did not take it for long due to feeling drowsy.  She felt like it did help with her tachycardia but made her feel too tired.   Normal cardiac echo 08/14/16- reviewed  She now notes that she seems to be SOB even when she is at rest. This has been present for several months but has seemed to be worse over the last couple of weeks She feels like her sx are similar to what brought her in back in September. Describes that she may be either active or at rest- such as sunbathing- when she will feel her heart speed up and then will feel panicky and like she can't breathe.  She last had this happened a few days ago- at that time it lasted about 2 hours No actual CP  No history of asthma No coughing or wheezing No fever  She has noted some stress from her job, but otherwise has not felt that she was that anxious.    She does feel like her SOB can be worse with exercise. She has stopped  going to the gym  BP Readings from Last 3 Encounters:  12/24/16 110/70  07/23/16 112/68  06/13/16 110/90   Wt Readings from Last 3 Encounters:  12/24/16 152 lb 3.2 oz (69 kg)  07/23/16 150 lb (68 kg)  06/13/16 144 lb (65.3 kg)   LMP was 2 weeks ago No history of DVT or PE. She is not on any hormones (not taking OCP currently) and has never been a smoker There are no active problems to display for this patient.   Past Medical History:  Diagnosis Date  . Chicken pox    as a child  . Frequent headaches   . History of UTI   . Hyperlipemia     No past surgical history on file.  Social History  Substance Use Topics  . Smoking status: Never Smoker  . Smokeless tobacco: Never Used  . Alcohol use No    Family History  Problem Relation Age of Onset  . Breast cancer Father   . Diabetes Father   . Heart attack Paternal Uncle   . Supraventricular tachycardia Maternal Grandmother   . Heart failure Maternal Grandmother   . Supraventricular tachycardia Paternal Grandmother   . Heart attack Paternal Grandfather     Allergies  Allergen Reactions  . Codeine Rash  . Penicillins Rash  Medication list has been reviewed and updated.  Current Outpatient Prescriptions on File Prior to Visit  Medication Sig Dispense Refill  . levonorgestrel-ethinyl estradiol (AVIANE,ALESSE,LESSINA) 0.1-20 MG-MCG tablet Take 1 tablet by mouth daily. 1 Package 11  . metoprolol tartrate (LOPRESSOR) 25 MG tablet Take 1 tablet (25 mg total) by mouth 2 (two) times daily. 60 tablet 6   No current facility-administered medications on file prior to visit.     Review of Systems:  As per HPI- otherwise negative. Never a smoker   Physical Examination: Vitals:   12/24/16 1521  BP: 110/70  Pulse: 83  Temp: 98 F (36.7 C)   Vitals:   12/24/16 1521  Weight: 152 lb 3.2 oz (69 kg)  Height: 5\' 10"  (1.778 m)   Body mass index is 21.84 kg/m. Ideal Body Weight: Weight in (lb) to have BMI = 25:  173.9  GEN: WDWN, NAD, Non-toxic, A & O x 3, normal weight, looks well HEENT: Atraumatic, Normocephalic. Neck supple. No masses, No LAD.  Bilateral TM wnl, oropharynx normal.  PEERL,EOMI.   Ears and Nose: No external deformity. CV: RRR, No M/G/R. No JVD. No thrill. No extra heart sounds. PULM: CTA B, no wheezes, crackles, rhonchi. No retractions. No resp. distress. No accessory muscle use. ABD: S, NT, ND EXTR: No c/c/e NEURO Normal gait.  PSYCH: Normally interactive. Conversant. Not depressed or anxious appearing.  Calm demeanor.   Dg Chest 2 View  Result Date: 12/24/2016 CLINICAL DATA:  26 year old female with intermittent shortness of breath, tachycardia. EXAM: CHEST  2 VIEW COMPARISON:  None. FINDINGS: Normal lung volumes. Normal cardiac size and mediastinal contours. Visualized tracheal air column is within normal limits. The lungs are clear. No pneumothorax or pleural effusion. Negative visualized osseous structures. Negative visible bowel gas pattern. IMPRESSION: Negative.  No acute cardiopulmonary abnormality. Electronically Signed   By: Odessa Fleming M.D.   On: 12/24/2016 16:20    Assessment and Plan: Tachycardia - Plan: propranolol (INDERAL) 10 MG tablet, ALPRAZolam (XANAX) 0.25 MG tablet  Shortness of breath - Plan: DG Chest 2 View, ALPRAZolam (XANAX) 0.25 MG tablet  Here today with continued episodes of intermittent tachycardia, now also associated with a feeling of SOB/ ?panic attack Will obtain CXR to ensure that her lungs appear normal.   She felt that metoprolol helped her but made her feel sleepy.  Will try a very low dose of propranolol for her.  Also gave a small supply of xanax for her to use when needed for panic attack Will check on her by phone in a few days   Meds ordered this encounter  Medications  . propranolol (INDERAL) 10 MG tablet    Sig: Take 1 every 8 - 12 hours as needed for racing heart    Dispense:  60 tablet    Refill:  2  . ALPRAZolam (XANAX) 0.25 MG  tablet    Sig: Take 1 tablet (0.25 mg total) by mouth 2 (two) times daily as needed for anxiety.    Dispense:  15 tablet    Refill:  0    Signed Abbe Amsterdam, MD

## 2016-12-24 NOTE — Patient Instructions (Signed)
It was nice to see you again today!  Please go to the imaging services dept on the ground floor for your chest x-ray; then you can head home and I will be in touch with your report  For your racing heart, try using the propranolol every 8- 12 hours as needed.   If you feel like you are having a panic attack, try taking one of your xanax but remember that this can make you feel sleepy!

## 2016-12-28 ENCOUNTER — Telehealth: Payer: Self-pay | Admitting: Family Medicine

## 2016-12-28 NOTE — Telephone Encounter (Signed)
[facility]- just checking on how she is doing.  Please let me know how she is- call us and leave message

## 2017-02-19 ENCOUNTER — Telehealth: Payer: Self-pay | Admitting: Family Medicine

## 2017-02-19 NOTE — Telephone Encounter (Signed)
Caller name: Malachi ParadiseMegan Worrell Relationship to patient: self Can be reached: [phone] [id number] [place] [address] [facility]  Reason for call: pt needing new RX for propanolol. She had filled 2 weeks ago but has lost the bottle. No meds left. She was not able to take today.

## 2017-02-21 ENCOUNTER — Other Ambulatory Visit: Payer: Self-pay | Admitting: Emergency Medicine

## 2017-02-21 DIAGNOSIS — R000 Tachycardia, unspecified: Secondary | ICD-10-CM

## 2017-02-21 MED ORDER — PROPRANOLOL HCL 10 MG PO TABS: ORAL_TABLET | ORAL | 2 refills | Status: DC

## 2017-02-21 NOTE — Telephone Encounter (Signed)
Refill sent.

## 2017-04-11 ENCOUNTER — Telehealth: Payer: Self-pay | Admitting: Family Medicine

## 2017-04-11 DIAGNOSIS — R000 Tachycardia, unspecified: Secondary | ICD-10-CM

## 2017-04-11 DIAGNOSIS — R0602 Shortness of breath: Secondary | ICD-10-CM

## 2017-04-11 MED ORDER — ALPRAZOLAM 0.25 MG PO TABS: 0.2500 mg | ORAL_TABLET | Freq: Two times a day (BID) | ORAL | 0 refills | Status: DC | PRN

## 2017-04-11 NOTE — Telephone Encounter (Signed)
Pt is requesting refill on alprazolam 0.25mg .   Last OV: 12/24/2016 Last Fill: 12/24/2016 #15 and 0RF   Please advise.

## 2017-11-12 DIAGNOSIS — S63601A Unspecified sprain of right thumb, initial encounter: Secondary | ICD-10-CM | POA: Diagnosis not present

## 2017-11-12 DIAGNOSIS — S63501A Unspecified sprain of right wrist, initial encounter: Secondary | ICD-10-CM | POA: Diagnosis not present

## 2018-06-30 ENCOUNTER — Ambulatory Visit: Payer: Self-pay

## 2018-06-30 NOTE — Telephone Encounter (Signed)
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  Today it is mild and she is driving and working. Last night she has a very bad spell feeling lightheaded after bending over to get a bath. She states the lightheaded symptoms make her feel shaky. She states that she checked her blood sugar because her dad is diabetic  but she states her BS was normal. She has had these symptoms before with a racing heart. She was sent to cardiology and was Dx with POTS. She does not feel that her heart is racing with today's symptoms.  She denies cold or congestion symptoms.  Pt states that she has seen an Opthomologist but her eyes are fine.  Appointment made per protocol.  Care advice read to patient. Pt verbalized understanding of all instructions.   Reason for Disposition . [1] MILD dizziness (e.g., walking normally) AND [2] has NOT been evaluated by physician for this  (Exception: dizziness caused by heat exposure, sudden standing, or poor fluid intake)  Answer Assessment - Initial Assessment Questions 1. DESCRIPTION: "Describe your dizziness."     Swimmy headed 2. LIGHTHEADED: "Do you feel lightheaded?" (e.g., somewhat faint, woozy, weak upon standing)     Shaky feeling especially when you bend over 3. VERTIGO: "Do you feel like either you or the room is spinning or tilting?" (i.e. vertigo)     no 4. SEVERITY: "How bad is it?"  "Do you feel like you are going to faint?" "Can you stand and walk?"   - MILD - walking normally   - MODERATE - interferes with normal activities (e.g., work, school)    - SEVERE - unable to stand, requires support to walk, feels like passing out now.      Today mild  5. ONSET:  "When did the dizziness begin?"     3-4 days 6. AGGRAVATING FACTORS: "Does anything make it worse?" (e.g., standing, change in head position)     Bending over makes it worse 7. HEART RATE: "Can you tell me your heart rate?" "How many beats in 15 seconds?"  (Note:  not all patients can do this)       Runs high POTS Dx. 8. CAUSE: "What do you think is causing the dizziness?"     No raising heart at this time 9. RECURRENT SYMPTOM: "Have you had dizziness before?" If so, ask: "When was the last time?" "What happened that time?"     no 10. OTHER SYMPTOMS: "Do you have any other symptoms?" (e.g., fever, chest pain, vomiting, diarrhea, bleeding)       no 11. PREGNANCY: "Is there any chance you are pregnant?" "When was your last menstrual period?"       No last menses 1.5 weeks ago  Protocols used: DIZZINESS [facility]

## 2018-07-01 ENCOUNTER — Encounter: Payer: Self-pay | Admitting: Medical

## 2018-07-01 ENCOUNTER — Telehealth: Payer: Self-pay | Admitting: Medical

## 2018-07-01 ENCOUNTER — Ambulatory Visit (INDEPENDENT_AMBULATORY_CARE_PROVIDER_SITE_OTHER): Payer: BLUE CROSS/BLUE SHIELD | Admitting: Medical

## 2018-07-01 VITALS — BP 109/76 | HR 70 | Temp 98.1°F | Resp 16 | Ht 70.0 in | Wt 167.8 lb

## 2018-07-01 DIAGNOSIS — I951 Orthostatic hypotension: Secondary | ICD-10-CM

## 2018-07-01 DIAGNOSIS — R42 Dizziness and giddiness: Secondary | ICD-10-CM

## 2018-07-01 DIAGNOSIS — I498 Other specified cardiac arrhythmias: Secondary | ICD-10-CM

## 2018-07-01 DIAGNOSIS — R5383 Other fatigue: Secondary | ICD-10-CM

## 2018-07-01 DIAGNOSIS — R000 Tachycardia, unspecified: Secondary | ICD-10-CM

## 2018-07-01 DIAGNOSIS — G4489 Other headache syndrome: Secondary | ICD-10-CM

## 2018-07-01 DIAGNOSIS — H814 Vertigo of central origin: Secondary | ICD-10-CM

## 2018-07-01 DIAGNOSIS — G90A Postural orthostatic tachycardia syndrome (POTS): Secondary | ICD-10-CM

## 2018-07-01 DIAGNOSIS — R51 Headache: Secondary | ICD-10-CM | POA: Diagnosis not present

## 2018-07-01 DIAGNOSIS — R519 Headache, unspecified: Secondary | ICD-10-CM

## 2018-07-01 MED ORDER — ONDANSETRON 4 MG PO TBDP: 4.0000 mg | ORAL_TABLET | Freq: Three times a day (TID) | ORAL | 0 refills | Status: DC | PRN

## 2018-07-01 MED ORDER — SUMATRIPTAN SUCCINATE 50 MG PO TABS: 50.0000 mg | ORAL_TABLET | ORAL | 0 refills | Status: DC | PRN

## 2018-07-01 NOTE — Telephone Encounter (Signed)
Please see ct of head without contrast order. Can you that prior authorized.

## 2018-07-01 NOTE — Progress Notes (Signed)
Subjective:    Patient ID: Janice Vance, female    DOB: 1991-06-15, 27 y.o.   MRN: [id number]  HPI  Pt in reporting last couple of days she was feeling very dizzy. Today she feels less dizzy/basically resolved.  Pt did see opthalmolgist. She did get pressures checked. 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  Pt states yesterday had HA that lasted all day. Pt states all day yesterday 5/10 HA.  Pt does have history of POTS. She states she is well hydrated. Pt was given propanolol by cardiologist. Pt had 10 mg cut to 5 mg and she felt too fatigued. But had to stop prapanolol completely.  Pt had full cardiac work up and her.  "Seen by myself last September with concern of feeling lightheaded and palpitations/ tachycardia-  She did see Dr. Elberta Fortis who dx with with likely POTS - she did a cardiac event monitor which did show tachycardia to approx 130 at rest. She also had an echo which was benign Dr. Elberta Fortis suggested metoprolol to help control her sx of tachycardia. However she did not take it for long due to feeling drowsy.  She felt like it did help with her tachycardia but made her feel too tired.   Normal cardiac echo 08/14/16- reviewed  She now notes that she seems to be SOB even when she is at rest. This has been present for several months but has seemed to be worse over the last couple of weeks She feels like her sx are similar to what brought her in back in September. Describes that she may be either active or at rest- such as sunbathing- when she will feel her heart speed up and then will feel panicky and like she can't breathe.  She last had this happened a few days ago- at that time it lasted about 2 hours No actual CP"   Currently she has no ha now at all. Also she is not dizzy at all as well.  LMP- 10 days ago.    Pt in past with pots would get dizzy, weak and felt like would pass out.   HA that she reports is new. Moderate severe  dizziness yesterday with and felt nausea.  Pt working moderate hard but not reporting being real stressed.  She was only taking one excedrin a day. At night if has HA she will get some light sensitivity.  1 family member with migraine. One has hydrocephalus.     Review of Systems  Constitutional: Negative for chills, diaphoresis and fatigue.  Eyes: Positive for photophobia. Negative for pain, discharge, itching and visual disturbance.  Respiratory: Negative for cough, chest tightness, shortness of breath and wheezing.   Cardiovascular: Negative for chest pain and palpitations.       See hpi.  Gastrointestinal: Positive for nausea. Negative for abdominal distention, abdominal pain and diarrhea.       See hpi.   Genitourinary: Negative for dysuria.  Musculoskeletal: Negative for back pain.  Neurological: Positive for dizziness and headaches. Negative for speech difficulty and light-headedness.       See hpi.  Hematological: Negative for adenopathy. Does not bruise/bleed easily.  Psychiatric/Behavioral: Negative for behavioral problems, confusion, sleep disturbance and suicidal ideas. The patient is not hyperactive.        Objective:   Physical Exam  General Mental Status- Alert. General Appearance- Not in acute distress.   Skin General: Color- Normal Color. Moisture- Normal Moisture.  Neck Carotid Arteries- Normal color. Moisture- Normal Moisture. No carotid bruits. No JVD.  Chest and Lung Exam Auscultation: Breath Sounds:-Normal.  Cardiovascular Auscultation:Rythm- Regular. Murmurs & Other Heart Sounds:Auscultation of the heart reveals- No Murmurs.  Abdomen Inspection:-Inspeection Normal. Palpation/Percussion:Note:No mass. Palpation and Percussion of the abdomen reveal- Non Tender, Non Distended + BS, no rebound or guarding.   Neurologic Cranial Nerve exam:- CN III-XII intact(No nystagmus), symmetric smile. Drift Test:- No drift. Romberg Exam:- Negative.  Heal  to Toe Gait exam:-Normal. Finger to Nose:- Normal/Intact Strength:- 5/5 equal and symmetric strength both upper and lower extremities..      Assessment & Plan:  For your recent episodes of moderate to severe headache with dizziness, I did go ahead and place CT of head ordered without contrast.  We need to check with your insurance and get authorization.  Hopefully will get update on that by tomorrow morning.  During the interim if your headaches return with neurologic signs symptoms as discussed then would recommend ED evaluation as they would not have to get authorization.  Some of your headache features indicate potential migraine headache.  Since she reports some light sensitivity, I am making Imitrex available.  Use as directed.  For dizziness and fatigue, we will get CBC, CMP, TSH, B12, B1, iron and vitamin D level.  With history of pots, want to make sure that you stay well-hydrated.  If you have dizziness that I want you to check your smart watch and make a note of your pulse readings.  You might need to be put back on low-dose beta-blocker.  Follow-up in 7 days or as needed.  40 minute spent with pt. 50% of time spent counseling on work up for signs/symptoms today as well as differential diagnosis.  Esperanza RichtersEdward Adalynn Corne, PA-C

## 2018-07-01 NOTE — Patient Instructions (Addendum)
For your recent episodes of moderate to severe headache with dizziness, I did go ahead and place CT of head ordered without contrast.  We need to check with your insurance and get authorization.  Hopefully will get update on that by tomorrow morning.  During the interim if your headaches return with neurologic signs symptoms as discussed then would recommend ED evaluation as they would not have to get authorization.  Some of your headache features indicate potential migraine headache.  Since she reports some light sensitivity, I am making Imitrex available.  Use as directed.  For dizziness and fatigue, we will get CBC, CMP, TSH, B12, B1, iron and vitamin D level.  With history of pots, want to make sure that you stay well-hydrated.  If you have dizziness that I want you to check your smart watch and make a note of your pulse readings.  You might need to be put back on low-dose beta-blocker.  Follow-up in 7 days or as needed.  Zofran prescription sent to pharmacy in the event you have nausea associated with the above symptoms.

## 2018-07-02 ENCOUNTER — Telehealth: Payer: Self-pay | Admitting: Medical

## 2018-07-02 ENCOUNTER — Ambulatory Visit (HOSPITAL_BASED_OUTPATIENT_CLINIC_OR_DEPARTMENT_OTHER)
Admission: RE | Admit: 2018-07-02 | Discharge: 2018-07-02 | Disposition: A | Discharge status: Home / Self Care | Payer: BLUE CROSS/BLUE SHIELD | Source: Ambulatory Visit | Attending: Medical | Admitting: Medical

## 2018-07-02 DIAGNOSIS — R51 Headache: Secondary | ICD-10-CM | POA: Diagnosis not present

## 2018-07-02 DIAGNOSIS — G4489 Other headache syndrome: Secondary | ICD-10-CM | POA: Diagnosis not present

## 2018-07-02 DIAGNOSIS — H814 Vertigo of central origin: Secondary | ICD-10-CM | POA: Insufficient documentation

## 2018-07-02 LAB — CBC WITH DIFFERENTIAL/PLATELET
BASOS PCT: 0.5 % (ref 0.0–3.0)
Basophils Absolute: 0 10*3/uL (ref 0.0–0.1)
EOS PCT: 1.9 % (ref 0.0–5.0)
Eosinophils Absolute: 0.1 10*3/uL (ref 0.0–0.7)
HCT: 36.3 % (ref 36.0–46.0)
HEMOGLOBIN: 12.7 g/dL (ref 12.0–15.0)
Lymphocytes Relative: 29.1 % (ref 12.0–46.0)
Lymphs Abs: 1.9 10*3/uL (ref 0.7–4.0)
MCHC: 35 g/dL (ref 30.0–36.0)
MCV: 87 fl (ref 78.0–100.0)
Monocytes Absolute: 0.5 10*3/uL (ref 0.1–1.0)
Monocytes Relative: 7.4 % (ref 3.0–12.0)
Neutro Abs: 3.9 10*3/uL (ref 1.4–7.7)
Neutrophils Relative %: 61.1 % (ref 43.0–77.0)
Platelets: 257 10*3/uL (ref 150.0–400.0)
RBC: 4.17 Mil/uL (ref 3.87–5.11)
RDW: 12.7 % (ref 11.5–15.5)
WBC: 6.4 10*3/uL (ref 4.0–10.5)

## 2018-07-02 LAB — COMPREHENSIVE METABOLIC PANEL
ALBUMIN: 4.6 g/dL (ref 3.5–5.2)
ALK PHOS: 30 U/L — AB (ref 39–117)
ALT: 9 U/L (ref 0–35)
AST: 13 U/L (ref 0–37)
BUN: 10 mg/dL (ref 6–23)
CO2: 30 mEq/L (ref 19–32)
CREATININE: 0.58 mg/dL (ref 0.40–1.20)
Calcium: 9.7 mg/dL (ref 8.4–10.5)
Chloride: 104 mEq/L (ref 96–112)
GFR: 130.8 mL/min (ref >60.00)
Glucose, Bld: 97 mg/dL (ref 70–99)
POTASSIUM: 4.1 meq/L (ref 3.5–5.1)
SODIUM: 141 meq/L (ref 135–145)
TOTAL PROTEIN: 6.6 g/dL (ref 6.0–8.3)
Total Bilirubin: 0.4 mg/dL (ref 0.2–1.2)

## 2018-07-02 LAB — FOLATE: Folate: 8.9 ng/mL (ref >5.9)

## 2018-07-02 LAB — VITAMIN B12: VITAMIN B 12: 202 pg/mL — AB (ref 211–911)

## 2018-07-02 LAB — TSH: TSH: 1.59 u[IU]/mL (ref 0.35–4.50)

## 2018-07-02 LAB — VITAMIN D 25 HYDROXY (VIT D DEFICIENCY, FRACTURES): VITD: 19.94 ng/mL — AB (ref 30.00–100.00)

## 2018-07-02 LAB — IRON: IRON: 41 ug/dL — AB (ref 42–145)

## 2018-07-02 MED ORDER — FERROUS SULFATE 325 (65 FE) MG PO TABS: 325.0000 mg | ORAL_TABLET | Freq: Every day | ORAL | 1 refills | Status: DC

## 2018-07-02 MED ORDER — VITAMIN D (ERGOCALCIFEROL) 1.25 MG (50000 UNIT) PO CAPS: 50000.0000 [IU] | ORAL_CAPSULE | ORAL | 0 refills | Status: DC

## 2018-07-02 NOTE — Telephone Encounter (Signed)
Rx iron and vit D sent to pt pharmacy.

## 2018-07-03 ENCOUNTER — Telehealth: Payer: Self-pay

## 2018-07-03 ENCOUNTER — Encounter: Payer: Self-pay | Admitting: Family Medicine

## 2018-07-03 NOTE — Telephone Encounter (Signed)
Copied from CRM [id number]. Topic: General - Other >> Jul 03, 2018 11:36 AM Fanny BienIlderton, Jessica L wrote: Reason for CRM: pt called and stated that she would like b12 injection sent to pharmacy. Pt states that she can give herself. Please advise

## 2018-07-05 LAB — VITAMIN B1: VITAMIN B1 (THIAMINE): 16 nmol/L (ref 8–30)

## 2018-07-14 ENCOUNTER — Ambulatory Visit: Payer: BLUE CROSS/BLUE SHIELD

## 2018-09-05 ENCOUNTER — Other Ambulatory Visit: Payer: Self-pay | Admitting: Medical

## 2018-09-07 ENCOUNTER — Other Ambulatory Visit: Payer: Self-pay | Admitting: Medical

## 2018-09-22 ENCOUNTER — Other Ambulatory Visit: Payer: Self-pay | Admitting: Medical

## 2018-09-23 MED ORDER — FERROUS SULFATE 325 (65 FE) MG PO TABS: ORAL_TABLET | ORAL | 1 refills | Status: DC

## 2018-09-23 NOTE — Addendum Note (Signed)
Addended by: Steve Rattler A on: 09/23/2018 01:18 PM   Modules accepted: Orders

## 2018-10-02 DIAGNOSIS — Z713 Dietary counseling and surveillance: Secondary | ICD-10-CM | POA: Diagnosis not present

## 2018-10-16 DIAGNOSIS — Z713 Dietary counseling and surveillance: Secondary | ICD-10-CM | POA: Diagnosis not present

## 2018-10-29 DIAGNOSIS — Z713 Dietary counseling and surveillance: Secondary | ICD-10-CM | POA: Diagnosis not present

## 2018-11-12 DIAGNOSIS — Z713 Dietary counseling and surveillance: Secondary | ICD-10-CM | POA: Diagnosis not present

## 2018-11-20 DIAGNOSIS — L700 Acne vulgaris: Secondary | ICD-10-CM | POA: Diagnosis not present

## 2018-12-10 DIAGNOSIS — Z713 Dietary counseling and surveillance: Secondary | ICD-10-CM | POA: Diagnosis not present

## 2018-12-30 NOTE — Progress Notes (Signed)
[facility] [address], [address] [place], [place] [address] [phone] [phone]  Date:  12/31/2018   Name:  Janice CuriaMegan Leigh Cambria   DOB:  02/02/1991   MRN:  [id number]  PCP:  Pearline Cablesopland, Jessica C, MD    Chief Complaint: No chief complaint on file.   History of Present Illness:  Janice Vance is a 28 y.o. very pleasant female patient who presents with the following:  Virtual visit today due to pandemic  Patient location is work Provider location is office Pt ID confirmed with name and DOB, she gives consent for virtual visit today   I last saw her in the office about 2 years ago for palpitations and tachycardia.  Dr. Elberta Fortisamnitz thought that she had likely had POTS, and suggested a beta-blocker She also saw Ramon Dredgedward last November with concern of dizziness and headache  Today she wishes to discuss an issue with her RIGHT hip She is having intermittent hip pain for 2 months No fall or any other injury Never had this in the past It will pop "randomly" It hurts when she walks, but not all the time  She notes that she has been to see a chiropractor x3-  They did not think this was related to her back They tried doing an adjustment   The pain is towards the groin, suspect this is actually joint pain as opposed to bursitis  She is not pregnant at this time Otherwise feeling well, no other concerns There are no active problems to display for this patient.   Past Medical History:  Diagnosis Date  . Chicken pox    as a child  . Frequent headaches   . History of UTI   . Hyperlipemia       Social History   Tobacco Use  . Smoking status: Never Smoker  . Smokeless tobacco: Never Used  Substance Use Topics  . Alcohol use: No  . Drug use: No    Family History  Problem Relation Age of Onset  . Breast cancer Father   . Diabetes Father   . Heart attack Paternal Uncle   . Supraventricular tachycardia Maternal Grandmother    . Heart failure Maternal Grandmother   . Supraventricular tachycardia Paternal Grandmother   . Heart attack Paternal Grandfather     Allergies  Allergen Reactions  . Codeine Rash  . Penicillins Rash    Medication list has been reviewed and updated.  Current Outpatient Medications on File Prior to Visit  Medication Sig Dispense Refill  . ALPRAZolam (XANAX) 0.25 MG tablet Take 1 tablet (0.25 mg total) by mouth 2 (two) times daily as needed for anxiety. (Patient not taking: Reported on 07/01/2018) 15 tablet 0  . ferrous sulfate 325 (65 FE) MG tablet TAKE 1 TABLET BY MOUTH EVERY DAY WITH BREAKFAST 90 tablet 1  . levonorgestrel-ethinyl estradiol (AVIANE,ALESSE,LESSINA) 0.1-20 MG-MCG tablet Take 1 tablet by mouth daily. (Patient not taking: Reported on 07/01/2018) 1 Package 11  . ondansetron (ZOFRAN ODT) 4 MG disintegrating tablet Take 1 tablet (4 mg total) by mouth every 8 (eight) hours as needed for nausea or vomiting. 20 tablet 0  . propranolol (INDERAL) 10 MG tablet Take 1 every 8 - 12 hours as needed for racing heart (Patient not taking: Reported on 07/01/2018) 60 tablet 2  . SUMAtriptan (IMITREX) 50 MG tablet Take 1 tablet (50 mg total) by mouth every 2 (two) hours as needed for migraine. May repeat in  2 hours if headache persists or recurs. 10 tablet 0  . Vitamin D, Ergocalciferol, (DRISDOL) 1.25 MG (50000 UT) CAPS capsule Take 1 capsule (50,000 Units total) by mouth every 7 (seven) days. 8 capsule 0   No current facility-administered medications on file prior to visit.     Review of Systems:  As per HPI- otherwise negative.  No cough or fever  Physical Examination: There were no vitals filed for this visit. There were no vitals filed for this visit. There is no height or weight on file to calculate BMI. Ideal Body Weight:    They are checking her temp at work.  Otherwise not checking vitals Patient is observed on video.  She looks well, no cough, wheezing, tachypnea is  observed  Assessment and Plan: Right hip pain - Plan: Ambulatory referral to Orthopedic Surgery  Virtual visit today with concern of right hip pain.  Patient has noted hip pain for about 2 months.  She has been to see her chiropractor who suggested that she get an x-ray.  I offered to bring her into the office and see her myself.  Alternatively, we can have her see orthopedics; they can likely see her relatively soon due to pandemic She would like to go directly to orthopedics, which is fine with me.  Placed referral today.  I asked her to let me know if appointment is delayed, or if any other concerns  Signed Abbe Amsterdam, MD

## 2018-12-31 ENCOUNTER — Encounter: Payer: Self-pay | Admitting: Family Medicine

## 2018-12-31 ENCOUNTER — Ambulatory Visit (INDEPENDENT_AMBULATORY_CARE_PROVIDER_SITE_OTHER): Payer: BLUE CROSS/BLUE SHIELD | Admitting: Family Medicine

## 2018-12-31 ENCOUNTER — Other Ambulatory Visit: Payer: Self-pay

## 2018-12-31 DIAGNOSIS — M25551 Pain in right hip: Secondary | ICD-10-CM | POA: Diagnosis not present

## 2019-01-07 DIAGNOSIS — Z713 Dietary counseling and surveillance: Secondary | ICD-10-CM | POA: Diagnosis not present

## 2019-01-07 DIAGNOSIS — M25551 Pain in right hip: Secondary | ICD-10-CM | POA: Diagnosis not present

## 2019-01-07 DIAGNOSIS — M7061 Trochanteric bursitis, right hip: Secondary | ICD-10-CM | POA: Diagnosis not present

## 2019-02-05 DIAGNOSIS — Z713 Dietary counseling and surveillance: Secondary | ICD-10-CM | POA: Diagnosis not present

## 2019-02-21 DIAGNOSIS — S71112A Laceration without foreign body, left thigh, initial encounter: Secondary | ICD-10-CM | POA: Diagnosis not present

## 2019-02-21 DIAGNOSIS — Z23 Encounter for immunization: Secondary | ICD-10-CM | POA: Diagnosis not present

## 2019-02-21 DIAGNOSIS — W458XXA Other foreign body or object entering through skin, initial encounter: Secondary | ICD-10-CM | POA: Diagnosis not present

## 2019-03-05 ENCOUNTER — Ambulatory Visit: Payer: BLUE CROSS/BLUE SHIELD | Admitting: Physician Assistant

## 2019-03-19 DIAGNOSIS — Z713 Dietary counseling and surveillance: Secondary | ICD-10-CM | POA: Diagnosis not present

## 2019-03-28 IMAGING — DX DG CHEST 2V
2 series · 2 of 2 positions shown · non-contrast
Comparison: None.

CLINICAL DATA: 26-year-old female with intermittent shortness of
breath, tachycardia.

EXAM:
CHEST  2 VIEW

[chest pa]
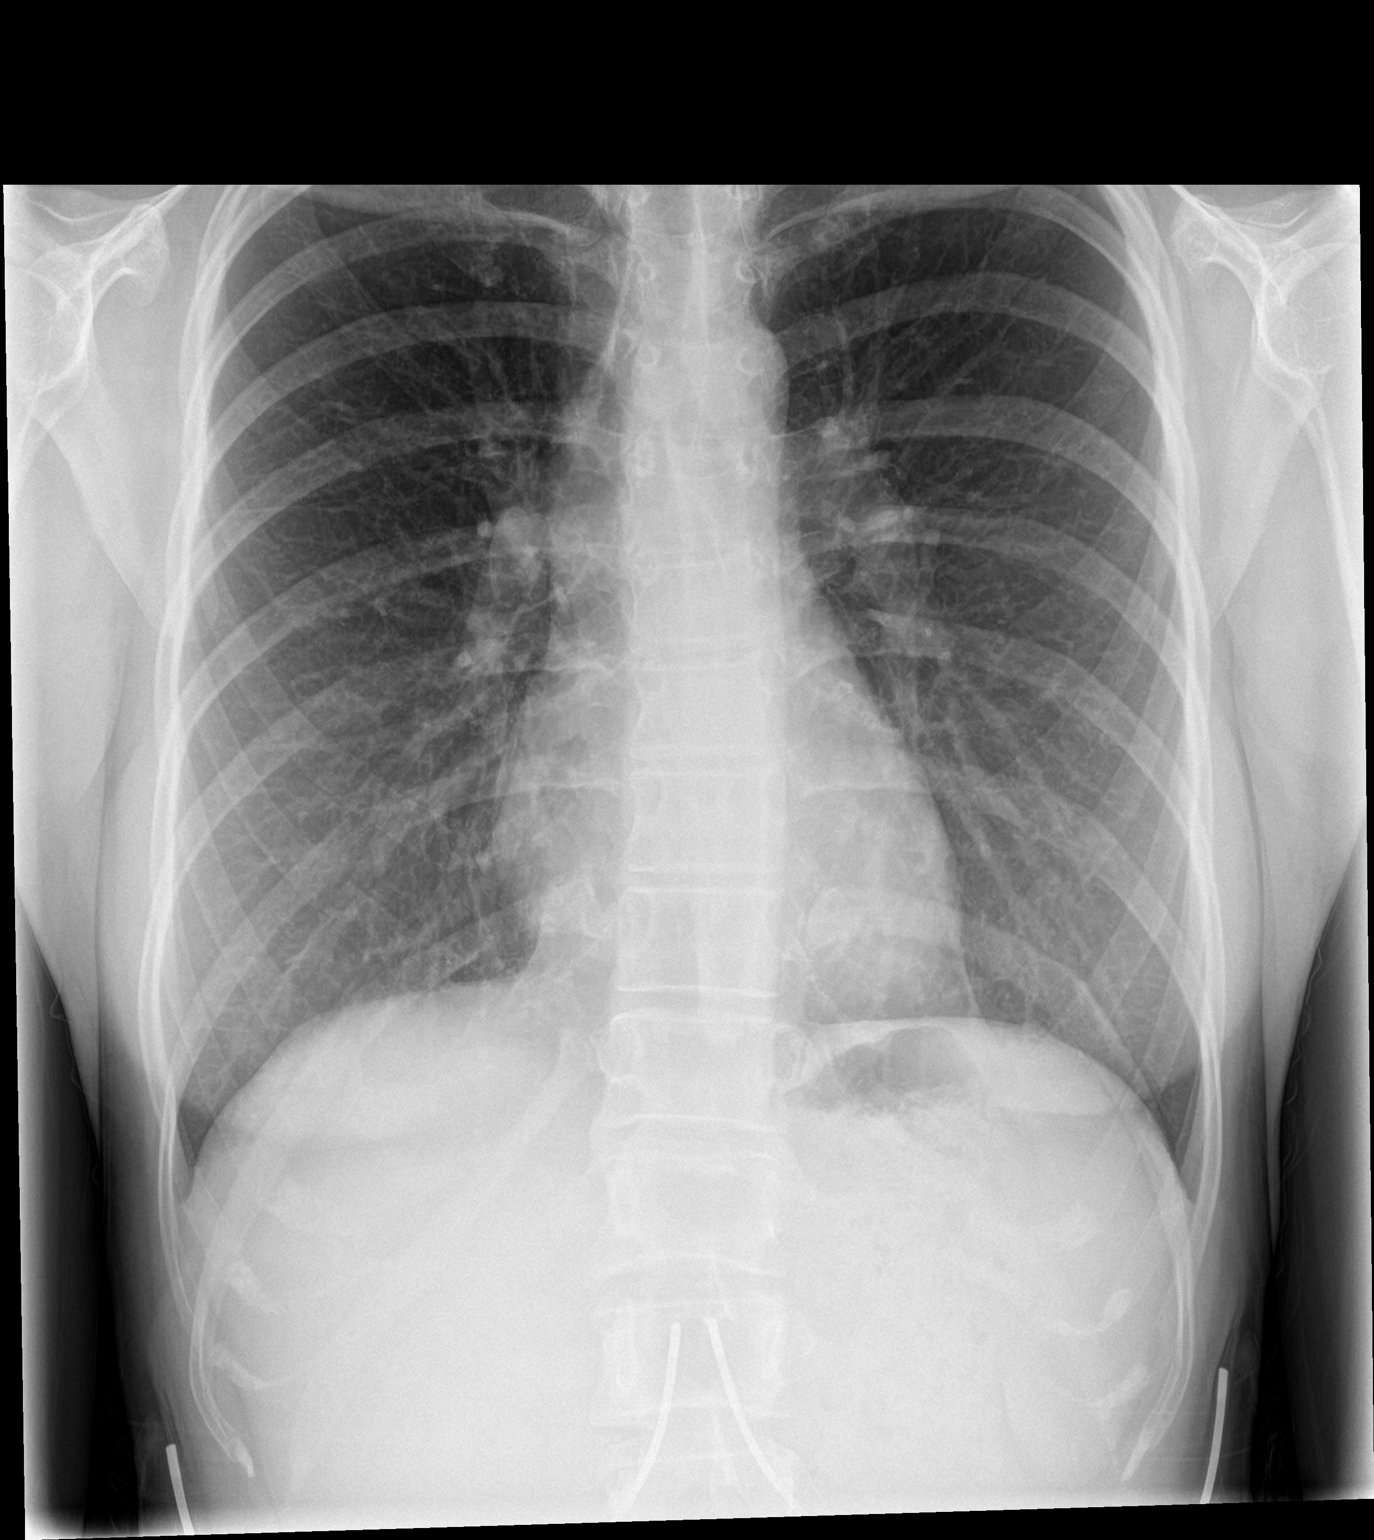

[chest lat]
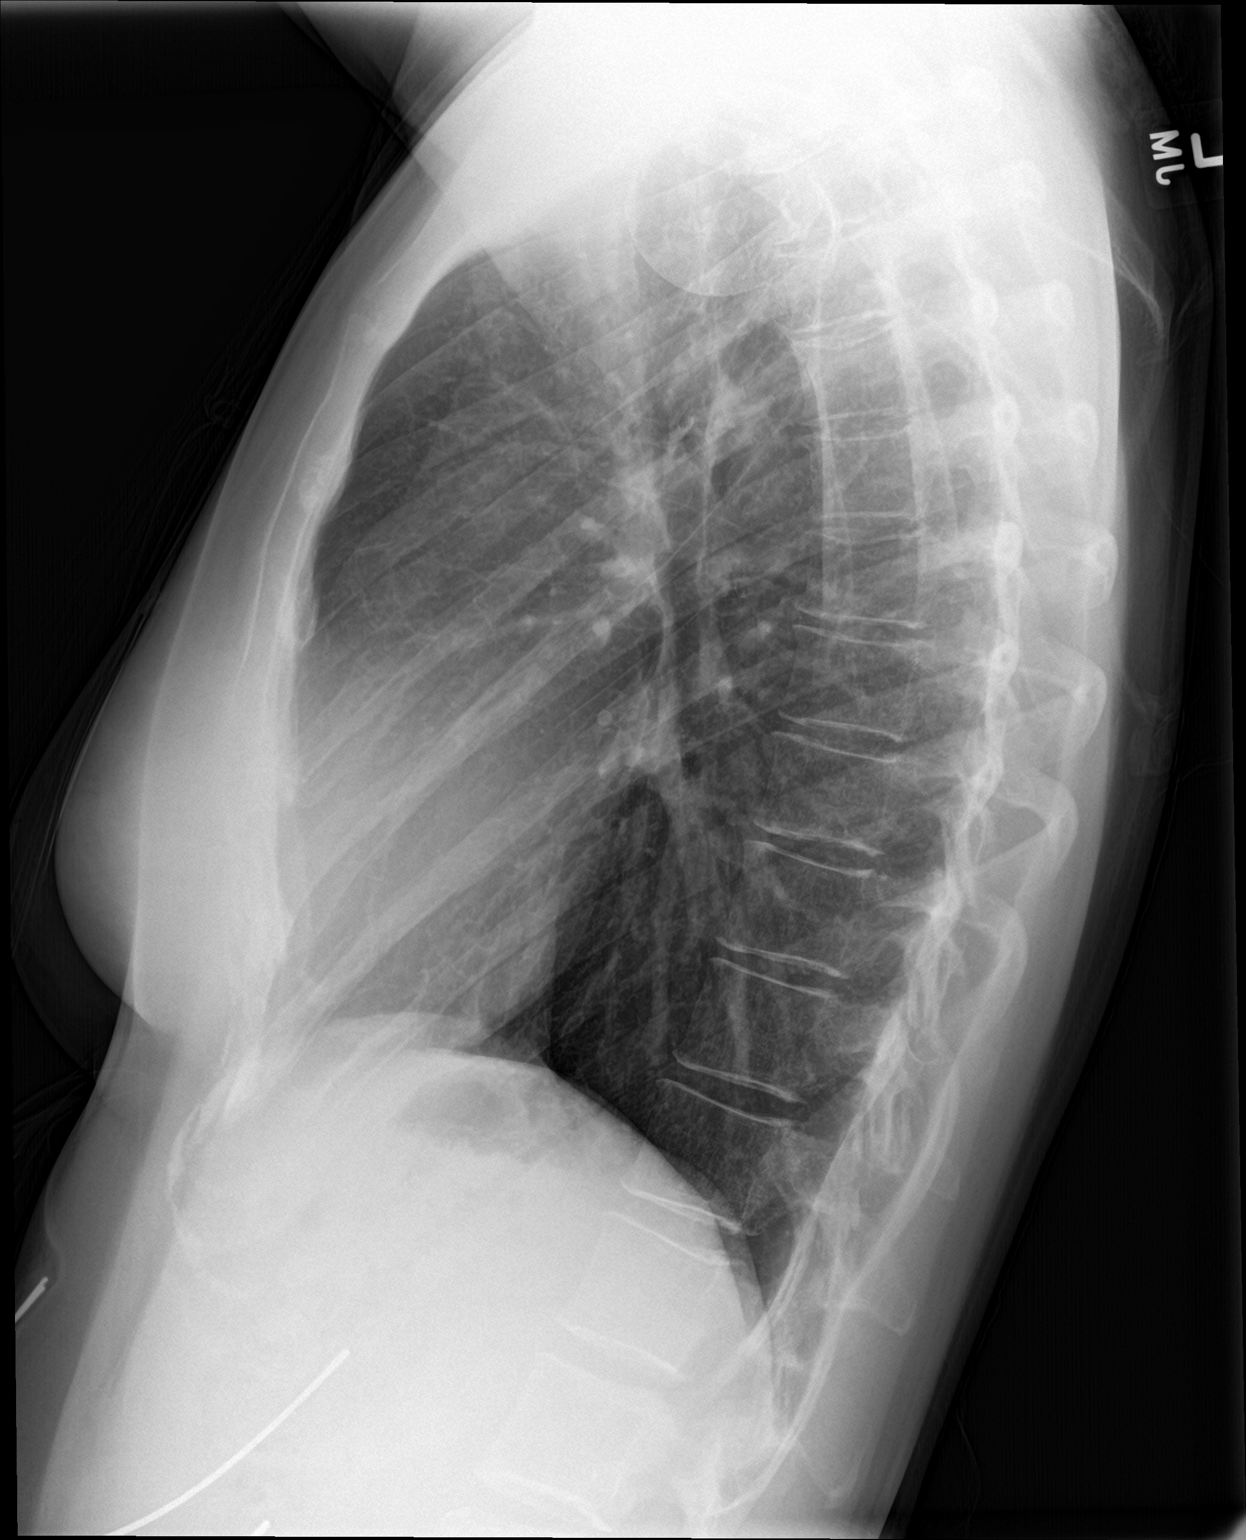

[2 of 2 positions shown; findings below may reference images not displayed]

FINDINGS: Normal lung volumes. Normal cardiac size and mediastinal contours.
Visualized tracheal air column is within normal limits. The lungs
are clear. No pneumothorax or pleural effusion. Negative visualized
osseous structures. Negative visible bowel gas pattern.
IMPRESSION: Negative.  No acute cardiopulmonary abnormality.

## 2019-05-27 ENCOUNTER — Ambulatory Visit (INDEPENDENT_AMBULATORY_CARE_PROVIDER_SITE_OTHER): Payer: BC Managed Care – PPO | Admitting: Family

## 2019-05-27 ENCOUNTER — Other Ambulatory Visit: Payer: Self-pay

## 2019-05-27 DIAGNOSIS — J029 Acute pharyngitis, unspecified: Secondary | ICD-10-CM | POA: Diagnosis not present

## 2019-05-27 MED ORDER — CEFUROXIME AXETIL 500 MG PO TABS: 500.0000 mg | ORAL_TABLET | Freq: Two times a day (BID) | ORAL | 0 refills | Status: DC

## 2019-05-27 NOTE — Progress Notes (Signed)
Virtual Visit via Video Note  I connected with Janice Vance on 05/27/19 at 10:20 AM EDT by a video enabled telemedicine application and verified that I am speaking with the correct person using two identifiers.  Location: Patient: parked car Provider: office   I discussed the limitations of evaluation and management by telemedicine and the availability of in person appointments. The patient expressed understanding and agreed to proceed.  History of Present Illness:  Patient is a 28 yr old female who presents today with chief complaint of sore throat. Reports that she usually gets strep 2-3 times a year. Niece recently tested positive for strep. Sore throat is moderate. Denies SOB/fever, cough, myalgia.   Past Medical History:  Diagnosis Date  . Chicken pox    as a child  . Frequent headaches   . History of UTI   . Hyperlipemia      Social History   Socioeconomic History  . Marital status: Single    Spouse name: Not on file  . Number of children: Not on file  . Years of education: Not on file  . Highest education level: Not on file  Occupational History  . Not on file  Social Needs  . Financial resource strain: Not on file  . Food insecurity    Worry: Not on file    Inability: Not on file  . Transportation needs    Medical: Not on file    Non-medical: Not on file  Tobacco Use  . Smoking status: Never Smoker  . Smokeless tobacco: Never Used  Substance and Sexual Activity  . Alcohol use: No  . Drug use: No  . Sexual activity: Not on file  Lifestyle  . Physical activity    Days per week: Not on file    Minutes per session: Not on file  . Stress: Not on file  Relationships  . Social Herbalist on phone: Not on file    Gets together: Not on file    Attends religious service: Not on file    Active member of club or organization: Not on file    Attends meetings of clubs or organizations: Not on file    Relationship status: Not on file  .  Intimate partner violence    Fear of current or ex partner: Not on file    Emotionally abused: Not on file    Physically abused: Not on file    Forced sexual activity: Not on file  Other Topics Concern  . Not on file  Social History Narrative  . Not on file    No past surgical history on file.  Family History  Problem Relation Age of Onset  . Breast cancer Father   . Diabetes Father   . Heart attack Paternal Uncle   . Supraventricular tachycardia Maternal Grandmother   . Heart failure Maternal Grandmother   . Supraventricular tachycardia Paternal Grandmother   . Heart attack Paternal Grandfather     Allergies  Allergen Reactions  . Codeine Rash  . Penicillins Rash    Current Outpatient Medications on File Prior to Visit  Medication Sig Dispense Refill  . ALPRAZolam (XANAX) 0.25 MG tablet Take 1 tablet (0.25 mg total) by mouth 2 (two) times daily as needed for anxiety. 15 tablet 0  . ferrous sulfate 325 (65 FE) MG tablet TAKE 1 TABLET BY MOUTH EVERY DAY WITH BREAKFAST 90 tablet 1  . levonorgestrel-ethinyl estradiol (AVIANE,ALESSE,LESSINA) 0.1-20 MG-MCG tablet Take 1 tablet by mouth daily.  1 Package 11  . ondansetron (ZOFRAN ODT) 4 MG disintegrating tablet Take 1 tablet (4 mg total) by mouth every 8 (eight) hours as needed for nausea or vomiting. 20 tablet 0  . propranolol (INDERAL) 10 MG tablet Take 1 every 8 - 12 hours as needed for racing heart 60 tablet 2  . SUMAtriptan (IMITREX) 50 MG tablet Take 1 tablet (50 mg total) by mouth every 2 (two) hours as needed for migraine. May repeat in 2 hours if headache persists or recurs. 10 tablet 0  . Vitamin D, Ergocalciferol, (DRISDOL) 1.25 MG (50000 UT) CAPS capsule Take 1 capsule (50,000 Units total) by mouth every 7 (seven) days. 8 capsule 0   No current facility-administered medications on file prior to visit.     There were no vitals taken for this visit.      Observations/Objective:   Gen: Awake, alert, no acute  distress ENT: 2-3+ tonsils. Uvula midline.  + erythema, no obvious exudates Resp: Breathing is even and non-labored Psych: calm/pleasant demeanor Neuro: Alert and Oriented x 3, + facial symmetry, speech is clear.   Assessment and Plan:  Pharyngitis- symptoms and history suggest strep throat. Pt has allergy to penicillin (rash).  Will rx with ceftin.  She is advised to call if symptoms fail to improve and to go to the ER if severe worsening throat pain or if she develops inability to eat/drink. Advised motrin prn pain, chloraseptic spray/salt water gargles prn.   Follow Up Instructions:    I discussed the assessment and treatment plan with the patient. The patient was provided an opportunity to ask questions and all were answered. The patient agreed with the plan and demonstrated an understanding of the instructions.   The patient was advised to call back or seek an in-person evaluation if the symptoms worsen or if the condition fails to improve as anticipated.  Lemont Fillers, NP

## 2019-07-15 DIAGNOSIS — R519 Headache, unspecified: Secondary | ICD-10-CM | POA: Diagnosis not present

## 2019-07-15 DIAGNOSIS — Z20828 Contact with and (suspected) exposure to other viral communicable diseases: Secondary | ICD-10-CM | POA: Diagnosis not present

## 2019-07-15 DIAGNOSIS — R197 Diarrhea, unspecified: Secondary | ICD-10-CM | POA: Diagnosis not present

## 2019-07-15 DIAGNOSIS — B349 Viral infection, unspecified: Secondary | ICD-10-CM | POA: Diagnosis not present

## 2019-07-28 DIAGNOSIS — T8069XA Other serum reaction due to other serum, initial encounter: Secondary | ICD-10-CM | POA: Diagnosis not present

## 2019-07-28 DIAGNOSIS — R21 Rash and other nonspecific skin eruption: Secondary | ICD-10-CM | POA: Diagnosis not present

## 2019-07-31 ENCOUNTER — Ambulatory Visit: Payer: BC Managed Care – PPO | Admitting: Family Medicine

## 2019-07-31 ENCOUNTER — Other Ambulatory Visit: Payer: Self-pay

## 2019-07-31 DIAGNOSIS — L259 Unspecified contact dermatitis, unspecified cause: Secondary | ICD-10-CM | POA: Diagnosis not present

## 2019-08-03 NOTE — Progress Notes (Signed)
Patient did not attend visit

## 2019-08-26 DIAGNOSIS — Z3169 Encounter for other general counseling and advice on procreation: Secondary | ICD-10-CM | POA: Diagnosis not present

## 2019-12-04 DIAGNOSIS — Z3169 Encounter for other general counseling and advice on procreation: Secondary | ICD-10-CM | POA: Diagnosis not present

## 2020-02-26 DIAGNOSIS — Z3149 Encounter for other procreative investigation and testing: Secondary | ICD-10-CM | POA: Diagnosis not present

## 2020-03-17 DIAGNOSIS — N979 Female infertility, unspecified: Secondary | ICD-10-CM | POA: Diagnosis not present

## 2020-03-22 DIAGNOSIS — Z3189 Encounter for other procreative management: Secondary | ICD-10-CM | POA: Diagnosis not present

## 2020-04-12 ENCOUNTER — Telehealth: Payer: Self-pay

## 2020-04-12 MED ORDER — AZITHROMYCIN 250 MG PO TABS: ORAL_TABLET | ORAL | 0 refills | Status: DC

## 2020-04-12 NOTE — Telephone Encounter (Signed)
Pt is at the beach- her sisters just tested positive for strep She notes a ST, mild runny nose that is just starting No fever noted She is covid immunized  Would like to be treated for strep pharyngitis if possible Will call in zpack as she is pcn allergic  Asked her to let me know if not getting better, and to be tested for covid 19- she agrees to do so

## 2020-04-12 NOTE — Telephone Encounter (Signed)
Patient called states she is on a family beach trip. Her entire family has strep throat. Her throat is very swollen, red, and trouble swallowing. No fever. She is unable to come in to the office. Patient is allergic to penicillin. If something could be sent she would like it to go to  [facility] [address] [employer] [phone]

## 2020-04-12 NOTE — Addendum Note (Signed)
Addended by: Abbe Amsterdam C on: 04/12/2020 11:44 AM   Modules accepted: Orders

## 2020-04-19 ENCOUNTER — Ambulatory Visit (INDEPENDENT_AMBULATORY_CARE_PROVIDER_SITE_OTHER): Payer: BC Managed Care – PPO | Admitting: Medical

## 2020-04-19 ENCOUNTER — Encounter: Payer: Self-pay | Admitting: Medical

## 2020-04-19 ENCOUNTER — Other Ambulatory Visit: Payer: Self-pay

## 2020-04-19 VITALS — BP 98/60 | HR 87 | Temp 97.8°F | Resp 13 | Ht 68.0 in | Wt 176.0 lb

## 2020-04-19 DIAGNOSIS — H6690 Otitis media, unspecified, unspecified ear: Secondary | ICD-10-CM | POA: Diagnosis not present

## 2020-04-19 MED ORDER — CEFUROXIME AXETIL 500 MG PO TABS: 500.0000 mg | ORAL_TABLET | Freq: Two times a day (BID) | ORAL | 0 refills | Status: DC

## 2020-04-19 MED ORDER — AZITHROMYCIN 250 MG PO TABS: ORAL_TABLET | ORAL | 0 refills | Status: DC

## 2020-04-19 NOTE — Progress Notes (Signed)
Subjective:    Patient ID: Janice Vance, female    DOB: 1990-12-29, 29 y.o.   MRN: [id number]  HPI  Pt in for rt ear pain with report of possible rupture per urgent care that pt was seen at.   Pt was prescribed ofloxacin drops and clindamycin capsules.  Pt states on Thursday night had fizzy sound to ear and some pain. She put warm compress to area and notes drainage to rt tm(states was bloody dc). [facility] and told ruptured TM.  Hx of ear infection when younger.   She also had 4 days of st prior to ear drainage. Her niece and nephew both had strep. Family tested negative 3 times for covid.  Pt states pain level is 6/10.  Pt has been on clindamycin 600 mg bid. But still has moderate pain.  Pt already made appointment with ENT next Friday.       Review of Systems  Constitutional: Negative for chills, fatigue and fever.  HENT: Positive for ear pain. Negative for congestion, mouth sores, postnasal drip, sinus pressure, sinus pain and sneezing.   Respiratory: Negative for cough, chest tightness and wheezing.   Cardiovascular: Negative for chest pain and palpitations.  Gastrointestinal: Negative for abdominal pain.  Musculoskeletal: Negative for back pain.  Skin: Negative for rash.  Neurological: Negative for syncope, facial asymmetry, speech difficulty, weakness, numbness and headaches.       Soe dizziness with ear pain/infection.  Hematological: Negative for adenopathy. Does not bruise/bleed easily.  Psychiatric/Behavioral: Negative for behavioral problems and confusion.    Past Medical History:  Diagnosis Date   Chicken pox    as a child   Frequent headaches    History of UTI    Hyperlipemia      Social History   Socioeconomic History   Marital status: Single    Spouse name: Not on file   Number of children: Not on file   Years of education: Not on file   Highest education level: Not on file  Occupational History   Not on  file  Tobacco Use   Smoking status: Never Smoker   Smokeless tobacco: Never Used  Substance and Sexual Activity   Alcohol use: No   Drug use: No   Sexual activity: Not on file  Other Topics Concern   Not on file  Social History Narrative   Not on file   Social Determinants of Health   Financial Resource Strain:    Difficulty of Paying Living Expenses: Not on file  Food Insecurity:    Worried About Running Out of Food in the Last Year: Not on file   Ran Out of Food in the Last Year: Not on file  Transportation Needs:    Lack of Transportation (Medical): Not on file   Lack of Transportation (Non-Medical): Not on file  Physical Activity:    Days of Exercise per Week: Not on file   Minutes of Exercise per Session: Not on file  Stress:    Feeling of Stress : Not on file  Social Connections:    Frequency of Communication with Friends and Family: Not on file   Frequency of Social Gatherings with Friends and Family: Not on file   Attends Religious Services: Not on file   Active Member of Clubs or Organizations: Not on file   Attends Banker Meetings: Not on file   Marital Status: Not on file  Intimate Partner Violence:    Fear  of Current or Ex-Partner: Not on file   Emotionally Abused: Not on file   Physically Abused: Not on file   Sexually Abused: Not on file    No past surgical history on file.  Family History  Problem Relation Age of Onset   Breast cancer Father    Diabetes Father    Heart attack Paternal Uncle    Supraventricular tachycardia Maternal Grandmother    Heart failure Maternal Grandmother    Supraventricular tachycardia Paternal Grandmother    Heart attack Paternal Grandfather     Allergies  Allergen Reactions   Codeine Rash   Penicillins Rash    Current Outpatient Medications on File Prior to Visit  Medication Sig Dispense Refill   clindamycin (CLEOCIN) 300 MG capsule Take 300 mg by mouth 2 (two)  times daily. Take 2 capsules by mouth every 8 hours for 7 days     ofloxacin (FLOXIN) 0.3 % OTIC solution Place 5 drops into the right ear daily.     ferrous sulfate 325 (65 FE) MG tablet TAKE 1 TABLET BY MOUTH EVERY DAY WITH BREAKFAST 90 tablet 1   No current facility-administered medications on file prior to visit.    BP 98/60 (BP Location: Right Arm, Patient Position: Sitting, Cuff Size: Large) Comment: unable to obtain   Pulse 87    Temp 97.8 F (36.6 C)    Resp 13    Ht 5\' 8"  (1.727 m)    Wt 176 lb (79.8 kg)    SpO2 97%    BMI 26.76 kg/m       Objective:   Physical Exam  General  Mental Status - Alert. General Appearance - Well groomed. Not in acute distress.  Skin Rashes- No Rashes.  HEENT Head- Normal. Ear Auditory Canal - Left- Normal. Right - Normal.Tympanic Membrane- Left- Normal. Right- dark reddish brown tm. I don't see hole in membrane or rupture presenltly Eye Sclera/Conjunctiva- Left- Normal. Right- Normal. Nose & Sinuses Nasal Mucosa- Left- not  boggy and Congested. Right- not  boggy and  Congested.Bilateral maxillary and frontal sinus pressure. Mouth & Throat Lips: Upper Lip- Normal: no dryness, cracking, pallor, cyanosis, or vesicular eruption. Lower Lip-Normal: no dryness, cracking, pallor, cyanosis or vesicular eruption. Buccal Mucosa- Bilateral- No Aphthous ulcers. Oropharynx- No Discharge or Erythema. Tonsils: Characteristics- Bilateral- No Erythema or Congestion. Size/Enlargement- Bilateral- No enlargement. Discharge- bilateral-None.  Neck Neck- Supple. No Masses.   Chest and Lung Exam Auscultation: Breath Sounds:-Clear even and unlabored.  Cardiovascular Auscultation:Rythm- Regular, rate and rhythm. Murmurs & Other Heart Sounds:Ausculatation of the heart reveal- No Murmurs.  Lymphatic Head & Neck General Head & Neck Lymphatics: Bilateral: Description- No Localized lymphadenopathy.       Assessment & Plan:  You  had recent sore throat  with exposure to strep followed by right ear discomfort with a rupture history.  Urgent care that saw you described very bright red TM and put you on high-dose clindamycin.  You for taking 600 mg twice daily.  You still have 6 out of 10 pain which I think is a bit high level pain in light of the dose and type of antibiotic you were given.  Allergy to penicillin but she had used Ceftin in the past without any reaction..  I want you to stop clindamycin and start Ceftin.  I want to see if your ear level pain decreases over the next couple days.  If it does not then I want you to add a azithromycin.  Twp antibiotics not  typical for ear  infection  but concerned that Ceftin alone might not be adequate.  Please make sure that you use probiotic tablets over-the-counter.  Also to use probiotic rich foods to eat.  Would ask that you give me an update  Friday on how you doing the study.  Send a MyChart message or call.  Follow-up in 7 days or as needed.  Keep ENT appointment that you have already scheduled for next month.  Continue eardrops   Time spent with patient today was 30   minutes which consisted of discussing diagnosis, treatment options, improtance of atending ent appoitment  and documentation.

## 2020-04-19 NOTE — Addendum Note (Signed)
Addended by: Gwenevere Abbot on: 04/19/2020 06:43 PM   Modules accepted: Orders

## 2020-04-19 NOTE — Patient Instructions (Addendum)
You  had recent sore throat with exposure to strep followed by right ear discomfort with a rupture history.  Urgent care that saw you described very bright red TM and put you on high-dose clindamycin.  You for taking 600 mg twice daily.  You still have 6 out of 10 pain which I think is a bit high level pain in light of the dose and type of antibiotic you were given.  Allergy to penicillin but you have used Ceftin in the past without any reaction..  I want you to stop clindamycin and start Ceftin.  I want to see if your ear level pain decreases over the next couple days.  If it does not then I want you to add a azithromycin.  Twp antibiotics not typical for ear  infection  but concerned that Ceftin alone might not be adequate.  Please make sure that you use probiotic tablets over-the-counter.  Also to eat  probiotic rich foods to eat.  Would ask that you give me an update  Friday on how you doing.  Send a MyChart message or call.  Follow-up in 7 days or as needed.  Keep ENT appointment that you have already scheduled for next month.  Continue eardrops.

## 2020-05-02 ENCOUNTER — Ambulatory Visit: Payer: BC Managed Care – PPO | Admitting: Family Medicine

## 2020-05-03 DIAGNOSIS — H6981 Other specified disorders of Eustachian tube, right ear: Secondary | ICD-10-CM | POA: Diagnosis not present

## 2020-05-14 DIAGNOSIS — Z319 Encounter for procreative management, unspecified: Secondary | ICD-10-CM | POA: Diagnosis not present

## 2020-06-09 DIAGNOSIS — N8500 Endometrial hyperplasia, unspecified: Secondary | ICD-10-CM | POA: Diagnosis not present

## 2020-06-09 DIAGNOSIS — E288 Other ovarian dysfunction: Secondary | ICD-10-CM | POA: Diagnosis not present

## 2020-06-09 DIAGNOSIS — Z3141 Encounter for fertility testing: Secondary | ICD-10-CM | POA: Diagnosis not present

## 2020-06-09 DIAGNOSIS — Z113 Encounter for screening for infections with a predominantly sexual mode of transmission: Secondary | ICD-10-CM | POA: Diagnosis not present

## 2020-06-23 DIAGNOSIS — Z113 Encounter for screening for infections with a predominantly sexual mode of transmission: Secondary | ICD-10-CM | POA: Diagnosis not present

## 2020-07-05 NOTE — Patient Instructions (Addendum)
It was great to see you again today, I will be in touch your labs soon as possible- we will fax in your insurance form once your labs come in.  Best of luck with your fertility process.  Have a wonderful holiday season!     Health Maintenance, Female Adopting a healthy lifestyle and getting preventive care are important in promoting health and wellness. Ask your health care provider about:  The right schedule for you to have regular tests and exams.  Things you can do on your own to prevent diseases and keep yourself healthy. What should I know about diet, weight, and exercise? Eat a healthy diet   Eat a diet that includes plenty of vegetables, fruits, low-fat dairy products, and lean protein.  Do not eat a lot of foods that are high in solid fats, added sugars, or sodium. Maintain a healthy weight Body mass index (BMI) is used to identify weight problems. It estimates body fat based on height and weight. Your health care provider can help determine your BMI and help you achieve or maintain a healthy weight. Get regular exercise Get regular exercise. This is one of the most important things you can do for your health. Most adults should:  Exercise for at least 150 minutes each week. The exercise should increase your heart rate and make you sweat (moderate-intensity exercise).  Do strengthening exercises at least twice a week. This is in addition to the moderate-intensity exercise.  Spend less time sitting. Even light physical activity can be beneficial. Watch cholesterol and blood lipids Have your blood tested for lipids and cholesterol at 30 years of age, then have this test every 5 years. Have your cholesterol levels checked more often if:  Your lipid or cholesterol levels are high.  You are older than 30 years of age.  You are at high risk for heart disease. What should I know about cancer screening? Depending on your health history and family history, you may need to have  cancer screening at various ages. This may include screening for:  Breast cancer.  Cervical cancer.  Colorectal cancer.  Skin cancer.  Lung cancer. What should I know about heart disease, diabetes, and high blood pressure? Blood pressure and heart disease  High blood pressure causes heart disease and increases the risk of stroke. This is more likely to develop in people who have high blood pressure readings, are of African descent, or are overweight.  Have your blood pressure checked: ? Every 3-5 years if you are 96-73 years of age. ? Every year if you are 62 years old or older. Diabetes Have regular diabetes screenings. This checks your fasting blood sugar level. Have the screening done:  Once every three years after age 69 if you are at a normal weight and have a low risk for diabetes.  More often and at a younger age if you are overweight or have a high risk for diabetes. What should I know about preventing infection? Hepatitis B If you have a higher risk for hepatitis B, you should be screened for this virus. Talk with your health care provider to find out if you are at risk for hepatitis B infection. Hepatitis C Testing is recommended for:  Everyone born from [birth year] through 1966.  Anyone with known risk factors for hepatitis C. Sexually transmitted infections (STIs)  Get screened for STIs, including gonorrhea and chlamydia, if: ? You are sexually active and are younger than 30 years of age. ? You are older than  30 years of age and your health care provider tells you that you are at risk for this type of infection. ? Your sexual activity has changed since you were last screened, and you are at increased risk for chlamydia or gonorrhea. Ask your health care provider if you are at risk.  Ask your health care provider about whether you are at high risk for HIV. Your health care provider may recommend a prescription medicine to help prevent HIV infection. If you choose to take  medicine to prevent HIV, you should first get tested for HIV. You should then be tested every 3 months for as long as you are taking the medicine. Pregnancy  If you are about to stop having your period (premenopausal) and you may become pregnant, seek counseling before you get pregnant.  Take 400 to 800 micrograms (mcg) of folic acid every day if you become pregnant.  Ask for birth control (contraception) if you want to prevent pregnancy. Osteoporosis and menopause Osteoporosis is a disease in which the bones lose minerals and strength with aging. This can result in bone fractures. If you are 59 years old or older, or if you are at risk for osteoporosis and fractures, ask your health care provider if you should:  Be screened for bone loss.  Take a calcium or vitamin D supplement to lower your risk of fractures.  Be given hormone replacement therapy (HRT) to treat symptoms of menopause. Follow these instructions at home: Lifestyle  Do not use any products that contain nicotine or tobacco, such as cigarettes, e-cigarettes, and chewing tobacco. If you need help quitting, ask your health care provider.  Do not use street drugs.  Do not share needles.  Ask your health care provider for help if you need support or information about quitting drugs. Alcohol use  Do not drink alcohol if: ? Your health care provider tells you not to drink. ? You are pregnant, may be pregnant, or are planning to become pregnant.  If you drink alcohol: ? Limit how much you use to 0-1 drink a day. ? Limit intake if you are breastfeeding.  Be aware of how much alcohol is in your drink. In the [place] one drink equals one 12 oz bottle of beer (355 mL), one 5 oz glass of wine (148 mL), or one 1 oz glass of hard liquor (44 mL). General instructions  Schedule regular health, dental, and eye exams.  Stay current with your vaccines.  Tell your health care provider if: ? You often feel depressed. ? You have  ever been abused or do not feel safe at home. Summary  Adopting a healthy lifestyle and getting preventive care are important in promoting health and wellness.  Follow your health care provider's instructions about healthy diet, exercising, and getting tested or screened for diseases.  Follow your health care provider's instructions on monitoring your cholesterol and blood pressure. This information is not intended to replace advice given to you by your health care provider. Make sure you discuss any questions you have with your health care provider. Document Revised: 07/30/2018 Document Reviewed: 07/30/2018 Elsevier Patient Education  2020 [employer].

## 2020-07-05 NOTE — Progress Notes (Addendum)
[facility] [address], [address] [place], [place] [address] [phone]  Date:  07/11/2020   Name:  Janice Vance   DOB:  28-May-1991   MRN:  [id number]  PCP:  Pearline Cables, MD    Chief Complaint: Annual Exam   History of Present Illness:  Janice Vance is a 28 y.o. very pleasant female patient who presents with the following:  Generally healthy young woman here today for physical exam Last seen by myself in May 2020 for hip pain  Hepatitis C and HIV screening- done with her IVF work- up Pap smear UTD Flu vaccine- give today  COVID-19 up-to-date Tetanus vaccine- July of 2020 per patient report Most recent routine labs 2019  She is doing IVF treatments right now-she has been through a collection and they hope to have the transfer in a couple of weeks.  Female factor infertility She has gained a few lbs due to hormones recently  She is going to [facility] in [place] right now for her IVF treatments, Dr. Billy Coast is her main gynecologist There are no problems to display for this patient.   Past Medical History:  Diagnosis Date  . Chicken pox    as a child  . Frequent headaches   . History of UTI   . Hyperlipemia     History reviewed. No pertinent surgical history.  Social History   Tobacco Use  . Smoking status: Never Smoker  . Smokeless tobacco: Never Used  Substance Use Topics  . Alcohol use: No  . Drug use: No    Family History  Problem Relation Age of Onset  . Breast cancer Father   . Diabetes Father   . Heart attack Paternal Uncle   . Supraventricular tachycardia Maternal Grandmother   . Heart failure Maternal Grandmother   . Supraventricular tachycardia Paternal Grandmother   . Heart attack Paternal Grandfather     Allergies  Allergen Reactions  . Codeine Rash  . Penicillins Rash    Medication list has been reviewed and updated.  Current Outpatient Medications on File Prior to  Visit  Medication Sig Dispense Refill  . aspirin EC 81 MG tablet Take 81 mg by mouth daily. Swallow whole.    . ferrous sulfate 325 (65 FE) MG tablet TAKE 1 TABLET BY MOUTH EVERY DAY WITH BREAKFAST 90 tablet 1   No current facility-administered medications on file prior to visit.    Review of Systems:  As per HPI- otherwise negative.   Physical Examination: Vitals:   07/11/20 1359  BP: 122/84  Pulse: 87  Resp: 16  SpO2: 97%   Vitals:   07/11/20 1359  Weight: 182 lb (82.6 kg)  Height: 5\' 8"  (1.727 m)   Body mass index is 27.67 kg/m. Ideal Body Weight: Weight in (lb) to have BMI = 25: 164.1  GEN: no acute distress.  Well-appearing, mild overweight HEENT: Atraumatic, Normocephalic.  Ears and Nose: No external deformity. CV: RRR, No M/G/R. No JVD. No thrill. No extra heart sounds. PULM: CTA B, no wheezes, crackles, rhonchi. No retractions. No resp. distress. No accessory muscle use. ABD: S, NT, ND, +BS. No rebound. No HSM. EXTR: No c/c/e PSYCH: Normally interactive. Conversant.    Assessment and Plan: Physical exam  Screening for deficiency anemia - Plan: CBC  Screening for diabetes mellitus - Plan: Comprehensive metabolic panel, Hemoglobin A1c  Screening for thyroid disorder - Plan: TSH  Screening, lipid - Plan:  Lipid panel  Screening for HIV without presence of risk factors  Encounter for hepatitis C screening test for low risk patient  Needs flu shot - Plan: Flu Vaccine QUAD 6+ mos PF IM (Fluarix Quad PF)   Generally healthy young woman here today for physical exam Her Pap is done through gynecology Flu shot given today COVID-19 is up-to-date, does not yet need her booster Encouraged healthy diet and exercise routine She has never been a smoker Right now she is going through IVF treatment for infertility, offer her support and encouragement Will plan further follow- up pending labs.  We will fill out and fax in her insurance form once labs  return This visit occurred during the SARS-CoV-2 public health emergency.  Safety protocols were in place, including screening questions prior to the visit, additional usage of staff PPE, and extensive cleaning of exam room while observing appropriate contact time as indicated for disinfecting solutions.    Signed Abbe Amsterdam, MD  Received her labs as below 11/23- message to pt and filled out her form Results for orders placed or performed in visit on 07/11/20  CBC  Result Value Ref Range   WBC 7.3 4.0 - 10.5 K/uL   RBC 3.99 3.87 - 5.11 Mil/uL   Platelets 332.0 150 - 400 K/uL   Hemoglobin 11.9 (L) 12.0 - 15.0 g/dL   HCT 80.3 (L) 36 - 46 %   MCV 85.7 78.0 - 100.0 fl   MCHC 34.8 30.0 - 36.0 g/dL   RDW 21.2 24.8 - 25.0 %  Comprehensive metabolic panel  Result Value Ref Range   Sodium 135 135 - 145 mEq/L   Potassium 4.5 3.5 - 5.1 mEq/L   Chloride 102 96 - 112 mEq/L   CO2 25 19 - 32 mEq/L   Glucose, Bld 99 70 - 99 mg/dL   BUN 11 6 - 23 mg/dL   Creatinine, Ser 0.37 0.40 - 1.20 mg/dL   Total Bilirubin 0.5 0.2 - 1.2 mg/dL   Alkaline Phosphatase 35 (L) 39 - 117 U/L   AST 14 0 - 37 U/L   ALT 11 0 - 35 U/L   Total Protein 6.7 6.0 - 8.3 g/dL   Albumin 4.4 3.5 - 5.2 g/dL   GFR 048.88 >91.69 mL/min   Calcium 9.7 8.4 - 10.5 mg/dL  Hemoglobin I5W  Result Value Ref Range   Hgb A1c MFr Bld 4.8 4.6 - 6.5 %  TSH  Result Value Ref Range   TSH 1.98 0.35 - 4.50 uIU/mL  Lipid panel  Result Value Ref Range   Cholesterol 160 0 - 200 mg/dL   Triglycerides 388.8 0 - 149 mg/dL   HDL 28.00 (L) >34.91 mg/dL   VLDL 79.1 0.0 - 50.5 mg/dL   LDL Cholesterol 95 0 - 99 mg/dL   Total CHOL/HDL Ratio 4    NonHDL 123.50

## 2020-07-11 ENCOUNTER — Encounter: Payer: Self-pay | Admitting: Family Medicine

## 2020-07-11 ENCOUNTER — Ambulatory Visit (INDEPENDENT_AMBULATORY_CARE_PROVIDER_SITE_OTHER): Payer: BC Managed Care – PPO | Admitting: Family Medicine

## 2020-07-11 ENCOUNTER — Other Ambulatory Visit: Payer: Self-pay

## 2020-07-11 VITALS — BP 122/84 | HR 87 | Resp 16 | Ht 68.0 in | Wt 182.0 lb

## 2020-07-11 DIAGNOSIS — Z23 Encounter for immunization: Secondary | ICD-10-CM | POA: Diagnosis not present

## 2020-07-11 DIAGNOSIS — Z0000 Encounter for general adult medical examination without abnormal findings: Secondary | ICD-10-CM | POA: Diagnosis not present

## 2020-07-11 DIAGNOSIS — Z1329 Encounter for screening for other suspected endocrine disorder: Secondary | ICD-10-CM

## 2020-07-11 DIAGNOSIS — Z13220 Encounter for screening for lipoid disorders: Secondary | ICD-10-CM | POA: Diagnosis not present

## 2020-07-11 DIAGNOSIS — Z130 Encounter for screening for diseases of the blood and blood-forming organs and certain disorders involving the immune mechanism: Secondary | ICD-10-CM | POA: Diagnosis not present

## 2020-07-11 DIAGNOSIS — Z131 Encounter for screening for diabetes mellitus: Secondary | ICD-10-CM | POA: Diagnosis not present

## 2020-07-11 DIAGNOSIS — Z114 Encounter for screening for human immunodeficiency virus [HIV]: Secondary | ICD-10-CM

## 2020-07-11 DIAGNOSIS — Z1159 Encounter for screening for other viral diseases: Secondary | ICD-10-CM

## 2020-07-12 ENCOUNTER — Encounter: Payer: Self-pay | Admitting: Family Medicine

## 2020-07-12 LAB — TSH: TSH: 1.98 u[IU]/mL (ref 0.35–4.50)

## 2020-07-12 LAB — COMPREHENSIVE METABOLIC PANEL
ALT: 11 U/L (ref 0–35)
AST: 14 U/L (ref 0–37)
Albumin: 4.4 g/dL (ref 3.5–5.2)
Alkaline Phosphatase: 35 U/L — ABNORMAL LOW (ref 39–117)
BUN: 11 mg/dL (ref 6–23)
CO2: 25 mEq/L (ref 19–32)
Calcium: 9.7 mg/dL (ref 8.4–10.5)
Chloride: 102 mEq/L (ref 96–112)
Creatinine, Ser: 0.59 mg/dL (ref 0.40–1.20)
GFR: 120.57 mL/min (ref >60.00)
Glucose, Bld: 99 mg/dL (ref 70–99)
Potassium: 4.5 mEq/L (ref 3.5–5.1)
Sodium: 135 mEq/L (ref 135–145)
Total Bilirubin: 0.5 mg/dL (ref 0.2–1.2)
Total Protein: 6.7 g/dL (ref 6.0–8.3)

## 2020-07-12 LAB — LIPID PANEL
Cholesterol: 160 mg/dL (ref 0–200)
HDL: 36.8 mg/dL — ABNORMAL LOW (ref >39.00)
LDL Cholesterol: 95 mg/dL (ref 0–99)
NonHDL: 123.5
Total CHOL/HDL Ratio: 4
Triglycerides: 145 mg/dL (ref 0.0–149.0)
VLDL: 29 mg/dL (ref 0.0–40.0)

## 2020-07-12 LAB — CBC
HCT: 34.2 % — ABNORMAL LOW (ref 36.0–46.0)
Hemoglobin: 11.9 g/dL — ABNORMAL LOW (ref 12.0–15.0)
MCHC: 34.8 g/dL (ref 30.0–36.0)
MCV: 85.7 fl (ref 78.0–100.0)
Platelets: 332 10*3/uL (ref 150.0–400.0)
RBC: 3.99 Mil/uL (ref 3.87–5.11)
RDW: 12.8 % (ref 11.5–15.5)
WBC: 7.3 10*3/uL (ref 4.0–10.5)

## 2020-07-12 LAB — HEMOGLOBIN A1C: Hgb A1c MFr Bld: 4.8 % (ref 4.6–6.5)

## 2020-07-25 DIAGNOSIS — Z113 Encounter for screening for infections with a predominantly sexual mode of transmission: Secondary | ICD-10-CM | POA: Diagnosis not present

## 2020-08-11 DIAGNOSIS — Z3200 Encounter for pregnancy test, result unknown: Secondary | ICD-10-CM | POA: Diagnosis not present

## 2020-08-15 DIAGNOSIS — Z20828 Contact with and (suspected) exposure to other viral communicable diseases: Secondary | ICD-10-CM | POA: Diagnosis not present

## 2020-08-15 DIAGNOSIS — Z3200 Encounter for pregnancy test, result unknown: Secondary | ICD-10-CM | POA: Diagnosis not present

## 2020-08-15 DIAGNOSIS — Z1159 Encounter for screening for other viral diseases: Secondary | ICD-10-CM | POA: Diagnosis not present

## 2020-08-20 NOTE — L&D Delivery Note (Signed)
Delivery Note At 10:18 PM a viable and healthy female was delivered via Vaginal, Spontaneous (Presentation: Right Occiput Anterior).  APGAR: 9, 9; weight  .   Placenta status: Spontaneous, Intact.  Cord: 3 vessels with the following complications: None.  Cord pH: na  Anesthesia: Epidural Episiotomy: None Lacerations:  second Suture Repair: 2.0 vicryl rapide Est. Blood Loss (mL):  300  Mom to postpartum.  Baby to Couplet care / Skin to Skin.  Deshanda Molitor J 04/17/2021, 10:31 PM

## 2020-08-26 DIAGNOSIS — Z3200 Encounter for pregnancy test, result unknown: Secondary | ICD-10-CM | POA: Diagnosis not present

## 2020-09-09 DIAGNOSIS — O0900 Supervision of pregnancy with history of infertility, unspecified trimester: Secondary | ICD-10-CM | POA: Diagnosis not present

## 2020-09-26 DIAGNOSIS — Z3689 Encounter for other specified antenatal screening: Secondary | ICD-10-CM | POA: Diagnosis not present

## 2020-10-03 IMAGING — CT CT HEAD W/O CM
3 series · 16 of 47 positions shown, 19 images · non-contrast
Comparison: None.

CLINICAL DATA: Headache.

EXAM:
CT HEAD WITHOUT CONTRAST
TECHNIQUE: Contiguous axial images were obtained from the base of the skull
through the vertex without intravenous contrast.

[Series 2: head wo · axial · 0.43mm/px · z∈[-301,-171]mm · 10 of 32 slices shown, 13 images]
[im 3/32  brain]
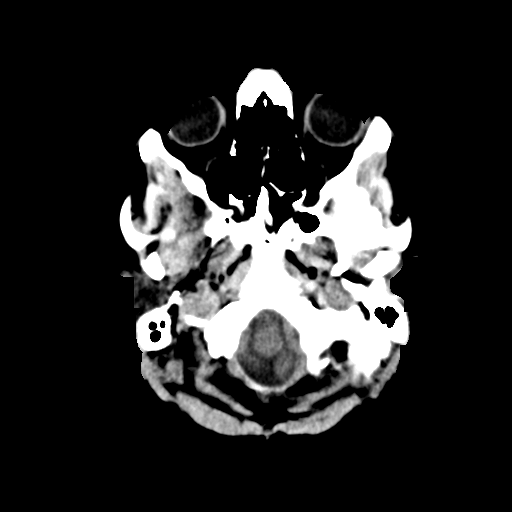
[im 3/32  bone]
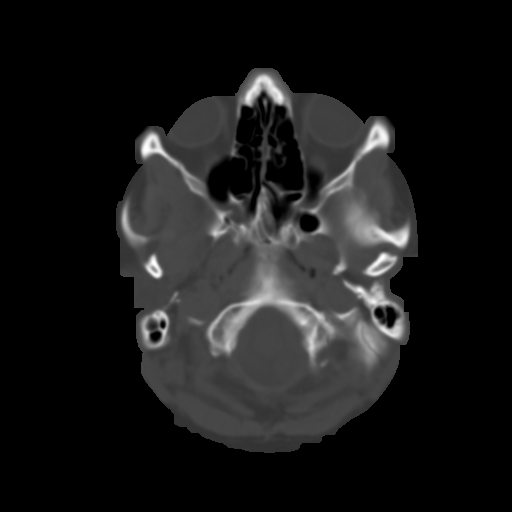
[im 6/32  brain]
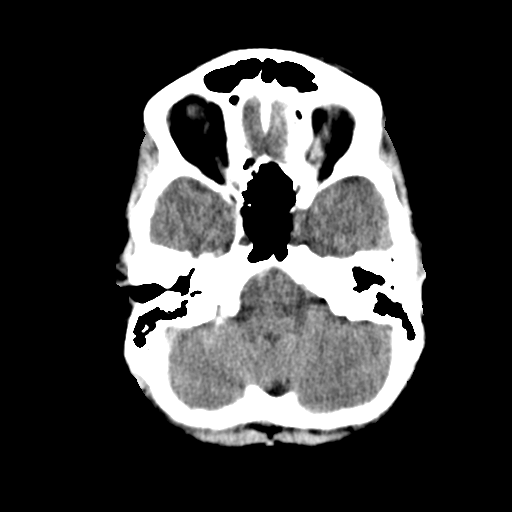
[im 9/32  brain]
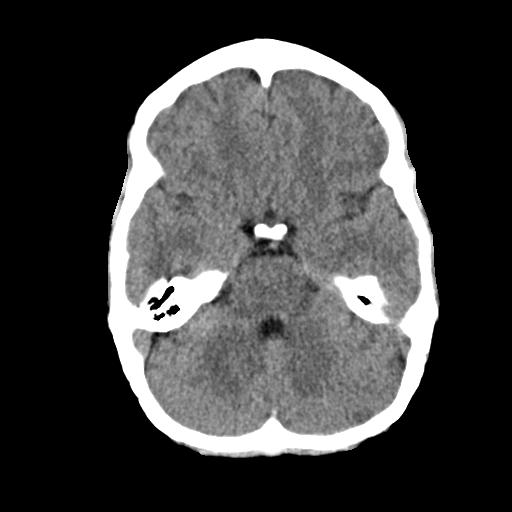
[im 11/32  brain]
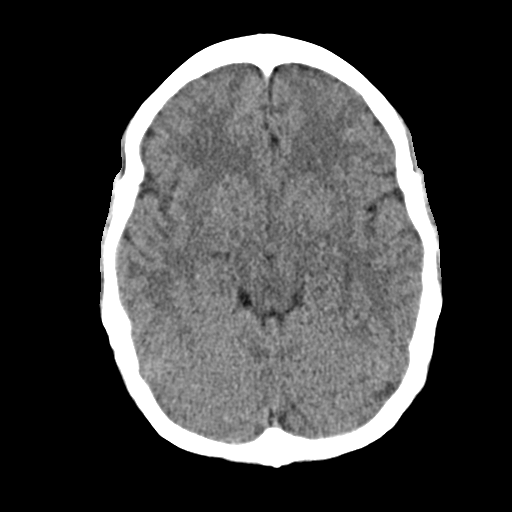
[im 14/32  brain]
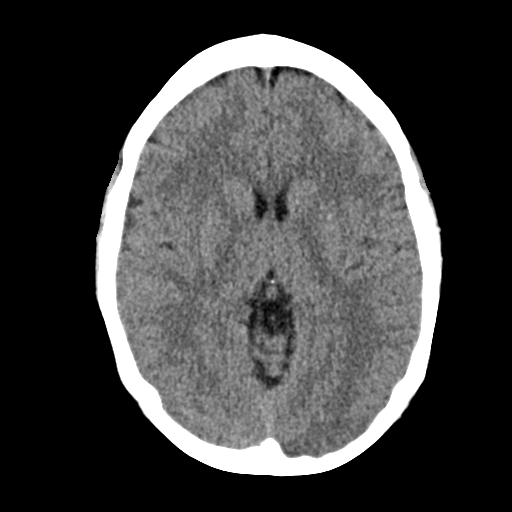
[im 14/32  bone]
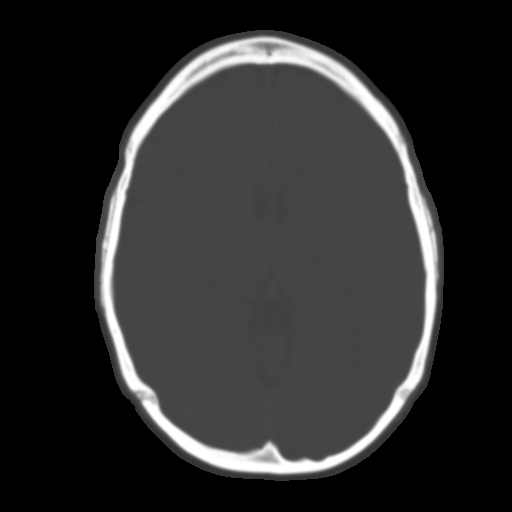
[im 18/32  brain]
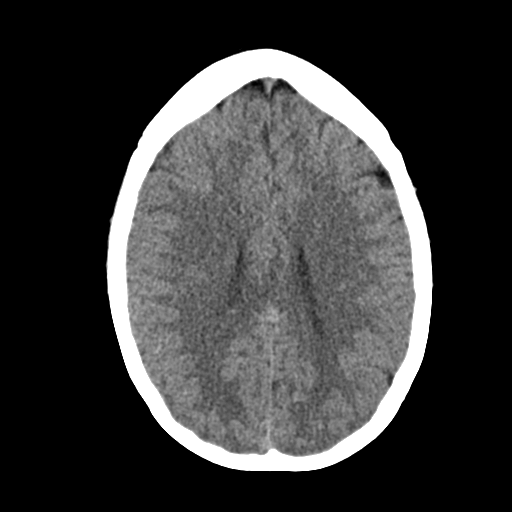
[im 21/32  brain]
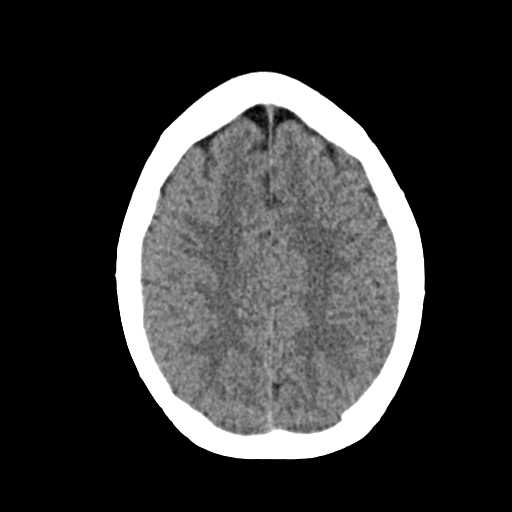
[im 24/32  brain]
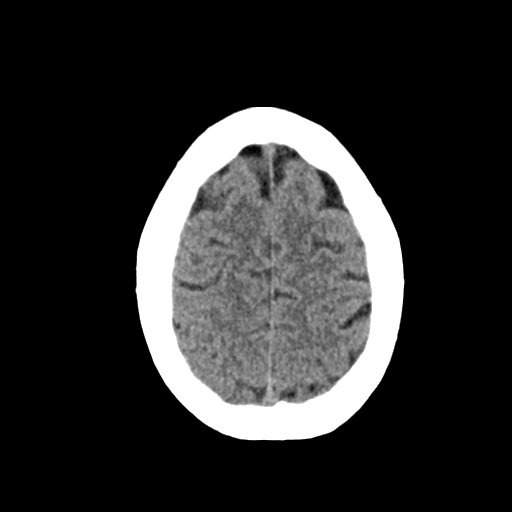
[im 26/32  brain]
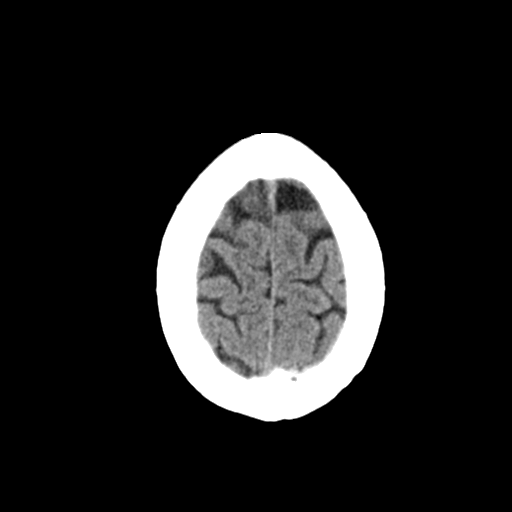
[im 26/32  bone]
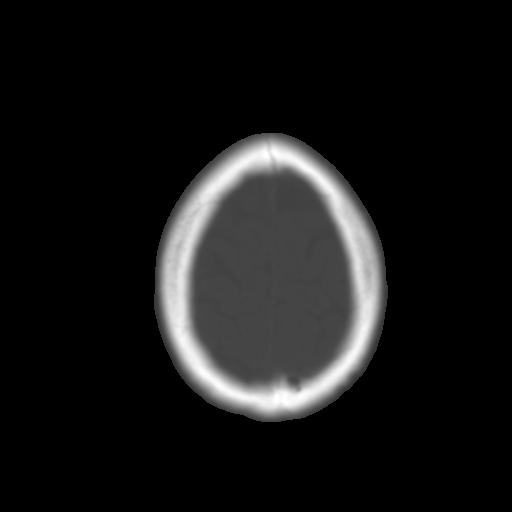
[im 29/32  brain]
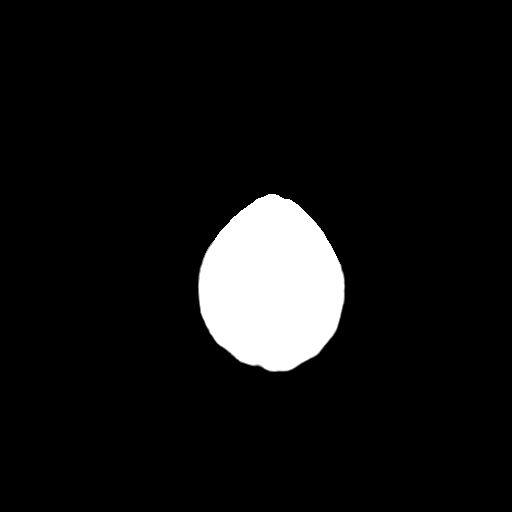

[Series 4: coronal soft · coronal · 0.32mm/px · 3 of 68 slices shown]
[im 23/68  brain]
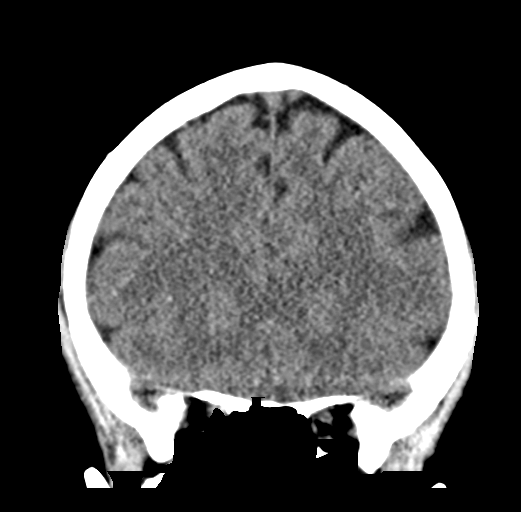
[im 30/68  brain]
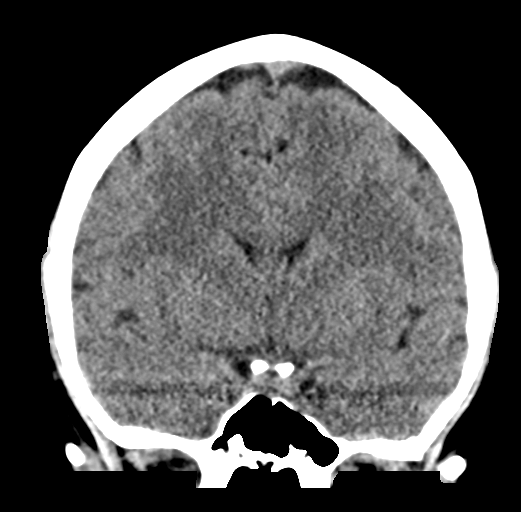
[im 38/68  brain]
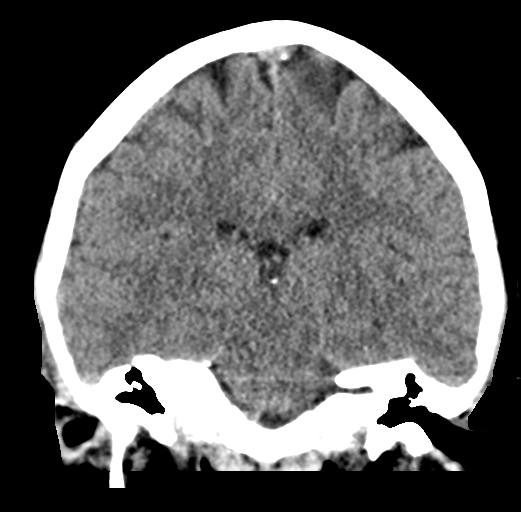

[Series 5: sag soft · sagittal · 0.32mm/px · 3 of 56 slices shown]
[im 19/56  brain]
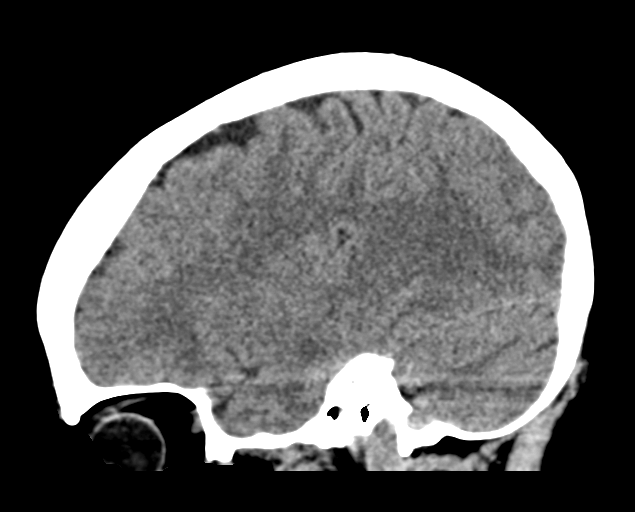
[im 28/56  brain]
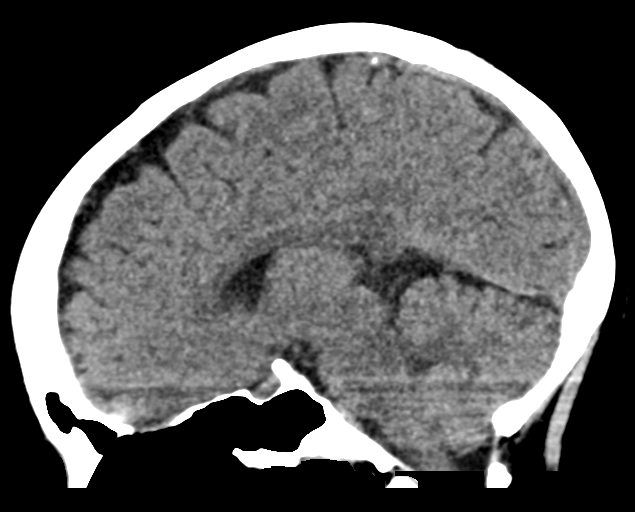
[im 37/56  brain]
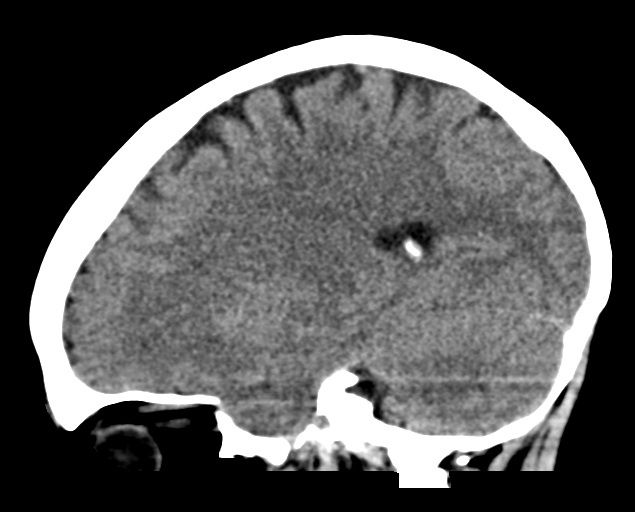

[16 of 47 positions shown; findings below may reference images not displayed]

FINDINGS: Brain: No evidence of acute infarction, hemorrhage, hydrocephalus,
extra-axial collection or mass lesion/mass effect.

Vascular: No hyperdense vessel or unexpected calcification.

Skull: Normal. Negative for fracture or focal lesion.

Sinuses/Orbits: No acute finding.

Other: None.
IMPRESSION: Normal head CT.

## 2020-10-04 ENCOUNTER — Encounter (HOSPITAL_COMMUNITY): Payer: Self-pay

## 2020-10-04 ENCOUNTER — Inpatient Hospital Stay (HOSPITAL_COMMUNITY)
Admission: AD | Admit: 2020-10-04 | Discharge: 2020-10-04 | Disposition: A | Discharge status: Home / Self Care | Payer: BC Managed Care – PPO | Attending: Obstetrics and Gynecology | Admitting: Obstetrics and Gynecology

## 2020-10-04 DIAGNOSIS — O210 Mild hyperemesis gravidarum: Secondary | ICD-10-CM | POA: Diagnosis not present

## 2020-10-04 DIAGNOSIS — O09811 Supervision of pregnancy resulting from assisted reproductive technology, first trimester: Secondary | ICD-10-CM | POA: Diagnosis not present

## 2020-10-04 DIAGNOSIS — Z880 Allergy status to penicillin: Secondary | ICD-10-CM | POA: Diagnosis not present

## 2020-10-04 DIAGNOSIS — Z885 Allergy status to narcotic agent status: Secondary | ICD-10-CM | POA: Insufficient documentation

## 2020-10-04 DIAGNOSIS — O219 Vomiting of pregnancy, unspecified: Secondary | ICD-10-CM | POA: Insufficient documentation

## 2020-10-04 DIAGNOSIS — Z3A11 11 weeks gestation of pregnancy: Secondary | ICD-10-CM | POA: Diagnosis not present

## 2020-10-04 DIAGNOSIS — Z7982 Long term (current) use of aspirin: Secondary | ICD-10-CM | POA: Insufficient documentation

## 2020-10-04 LAB — COMPREHENSIVE METABOLIC PANEL
ALT: 13 U/L (ref 0–44)
AST: 15 U/L (ref 15–41)
Albumin: 3.9 g/dL (ref 3.5–5.0)
Alkaline Phosphatase: 29 U/L — ABNORMAL LOW (ref 38–126)
Anion gap: 11 (ref 5–15)
BUN: 9 mg/dL (ref 6–20)
CO2: 20 mmol/L — ABNORMAL LOW (ref 22–32)
Calcium: 8.8 mg/dL — ABNORMAL LOW (ref 8.9–10.3)
Chloride: 104 mmol/L (ref 98–111)
Creatinine, Ser: 0.42 mg/dL — ABNORMAL LOW (ref 0.44–1.00)
GFR, Estimated: >60 mL/min (ref >60)
Glucose, Bld: 103 mg/dL — ABNORMAL HIGH (ref 70–99)
Potassium: 3.6 mmol/L (ref 3.5–5.1)
Sodium: 135 mmol/L (ref 135–145)
Total Bilirubin: 0.5 mg/dL (ref 0.3–1.2)
Total Protein: 6.3 g/dL — ABNORMAL LOW (ref 6.5–8.1)

## 2020-10-04 LAB — URINALYSIS, ROUTINE W REFLEX MICROSCOPIC
Bilirubin Urine: NEGATIVE
Glucose, UA: NEGATIVE mg/dL
Hgb urine dipstick: NEGATIVE
Ketones, ur: NEGATIVE mg/dL
Nitrite: NEGATIVE
Protein, ur: NEGATIVE mg/dL
Specific Gravity, Urine: 1.027 (ref 1.005–1.030)
pH: 5 (ref 5.0–8.0)

## 2020-10-04 LAB — CBC
HCT: 37.1 % (ref 36.0–46.0)
Hemoglobin: 12.9 g/dL (ref 12.0–15.0)
MCH: 29.7 pg (ref 26.0–34.0)
MCHC: 34.8 g/dL (ref 30.0–36.0)
MCV: 85.5 fL (ref 80.0–100.0)
Platelets: 244 10*3/uL (ref 150–400)
RBC: 4.34 MIL/uL (ref 3.87–5.11)
RDW: 11.9 % (ref 11.5–15.5)
WBC: 8.4 10*3/uL (ref 4.0–10.5)
nRBC: 0 % (ref 0.0–0.2)

## 2020-10-04 LAB — POCT PREGNANCY, URINE: Preg Test, Ur: POSITIVE — AB

## 2020-10-04 MED ORDER — LACTATED RINGERS IV BOLUS
1000.0000 mL | Freq: Once | INTRAVENOUS | Status: AC
  Administered 2020-10-04: 1000 mL via INTRAVENOUS

## 2020-10-04 MED ORDER — ONDANSETRON 4 MG PO TBDP: 4.0000 mg | ORAL_TABLET | Freq: Four times a day (QID) | ORAL | 0 refills | Status: DC | PRN

## 2020-10-04 MED ORDER — ONDANSETRON HCL 4 MG/2ML IJ SOLN
4.0000 mg | Freq: Once | INTRAMUSCULAR | Status: AC
  Administered 2020-10-04: 4 mg via INTRAVENOUS
  Filled 2020-10-04: qty 2

## 2020-10-04 MED ORDER — PROMETHAZINE HCL 12.5 MG PO TABS: 12.5000 mg | ORAL_TABLET | Freq: Four times a day (QID) | ORAL | 0 refills | Status: DC | PRN

## 2020-10-04 NOTE — MAU Provider Note (Signed)
History     CSN: [id number]  Arrival date and time: 10/04/20 2542   Event Date/Time   First Provider Initiated Contact with Patient 10/04/20 0920      Chief Complaint  Patient presents with  . Emesis   HPI Janice Vance is a 30 y.o. G2P0010 at [redacted]w[redacted]d with IVF pregnancy who presents to MAU with chief complaint of nausea and vomiting. This is a recurrent problem for which patient has been prescribed Meclizine but that has stopped being effective. She endorses more frequent and prolonged episodes of vomiting over the past 4-5 days. She states she vomited almost continuously overnight. She called her prenatal care provider this morning, was offered a prescription for Phenergan. As she described her symptoms in more detail she was advised to present to MAU for evaluation.  She denies abdominal pain, vaginal bleeding, dysuria,   Patient receives care with [facility]. Dr. Billy Coast is her primary.  OB History    Gravida  2   Para      Term      Preterm      AB  1   Living        SAB  1   IAB      Ectopic      Multiple      Live Births              Past Medical History:  Diagnosis Date  . Chicken pox    as a child  . Frequent headaches   . History of UTI   . Hyperlipemia     History reviewed. No pertinent surgical history.  Family History  Problem Relation Age of Onset  . Breast cancer Father   . Diabetes Father   . Heart attack Paternal Uncle   . Supraventricular tachycardia Maternal Grandmother   . Heart failure Maternal Grandmother   . Supraventricular tachycardia Paternal Grandmother   . Heart attack Paternal Grandfather     Social History   Tobacco Use  . Smoking status: Never Smoker  . Smokeless tobacco: Never Used  Substance Use Topics  . Alcohol use: No  . Drug use: No    Allergies:  Allergies  Allergen Reactions  . Codeine Rash  . Penicillins Rash    Medications Prior to Admission  Medication Sig Dispense Refill Last  Dose  . meclizine (ANTIVERT) 12.5 MG tablet Take 12.5 mg by mouth 3 (three) times daily as needed for dizziness.   Past Week at Unknown time  . Prenatal Vit-Fe Fumarate-FA (MULTIVITAMIN-PRENATAL) 27-0.8 MG TABS tablet Take 1 tablet by mouth daily at 12 noon.   10/03/2020 at Unknown time  . progesterone 200 MG SUPP 200 mg at bedtime.   10/03/2020 at Unknown time  . aspirin EC 81 MG tablet Take 81 mg by mouth daily. Swallow whole.     . ferrous sulfate 325 (65 FE) MG tablet TAKE 1 TABLET BY MOUTH EVERY DAY WITH BREAKFAST 90 tablet 1     Review of Systems  Gastrointestinal: Positive for nausea and vomiting.  All other systems reviewed and are negative.  Physical Exam   Blood pressure 122/87, pulse 98, temperature 98.3 F (36.8 C), resp. rate 19, weight 83.3 kg.  Physical Exam Vitals and nursing note reviewed. Exam conducted with a chaperone present.  Constitutional:      Appearance: Normal appearance. She is ill-appearing.  Cardiovascular:     Rate and Rhythm: Normal rate.     Pulses: Normal pulses.  Abdominal:  General: Bowel sounds are normal.     Tenderness: There is no abdominal tenderness. There is no right CVA tenderness or left CVA tenderness.  Skin:    Capillary Refill: Capillary refill takes less than 2 seconds.  Neurological:     Mental Status: She is alert and oriented to person, place, and time.  Psychiatric:        Mood and Affect: Mood normal.        Behavior: Behavior normal.        Thought Content: Thought content normal.        Judgment: Judgment normal.     MAU Course  Procedures  --IVF pregnancy. Fetus with cardiac activity visualized on BSUS --Abnormal UA, no urinary symptoms. Discussed initiating antibiotics now vs waiting for culture results. Given severe recurrent nausea and vomiting and absence of urinary complaints, patient verbalizes preference to wait for culture.  --Discussed with the patient the risk of Zofran use in the first trimester of  pregnancy. Risks of use in the first trimester include birth defects in babies, specifically cleft lip/palate.  Pt states understanding and plans to use Zofran sparingly.     Orders Placed This Encounter  Procedures  . Culture, OB Urine  . Urinalysis, Routine w reflex microscopic Urine, Clean Catch  . CBC  . Comprehensive metabolic panel  . Pregnancy, urine POC  . Blood draw with IV start  . Discharge patient   Patient Vitals for the past 24 hrs:  BP Temp Pulse Resp SpO2 Weight  10/04/20 1032 116/75 -- 75 15 100 % --  10/04/20 0854 122/87 98.3 F (36.8 C) 98 19 -- 83.3 kg   Results for orders placed or performed during the hospital encounter of 10/04/20 (from the past 24 hour(s))  Urinalysis, Routine w reflex microscopic Urine, Clean Catch     Status: Abnormal   Collection Time: 10/04/20  9:07 AM  Result Value Ref Range   Color, Urine YELLOW YELLOW   APPearance CLOUDY (A) CLEAR   Specific Gravity, Urine 1.027 1.005 - 1.030   pH 5.0 5.0 - 8.0   Glucose, UA NEGATIVE NEGATIVE mg/dL   Hgb urine dipstick NEGATIVE NEGATIVE   Bilirubin Urine NEGATIVE NEGATIVE   Ketones, ur NEGATIVE NEGATIVE mg/dL   Protein, ur NEGATIVE NEGATIVE mg/dL   Nitrite NEGATIVE NEGATIVE   Leukocytes,Ua TRACE (A) NEGATIVE   RBC / HPF 0-5 0 - 5 RBC/hpf   WBC, UA 6-10 0 - 5 WBC/hpf   Bacteria, UA MANY (A) NONE SEEN   Squamous Epithelial / LPF 0-5 0 - 5   Mucus PRESENT   Pregnancy, urine POC     Status: Abnormal   Collection Time: 10/04/20  9:08 AM  Result Value Ref Range   Preg Test, Ur POSITIVE (A) NEGATIVE  CBC     Status: None   Collection Time: 10/04/20  9:38 AM  Result Value Ref Range   WBC 8.4 4.0 - 10.5 K/uL   RBC 4.34 3.87 - 5.11 MIL/uL   Hemoglobin 12.9 12.0 - 15.0 g/dL   HCT 78.2 95.6 - 21.3 %   MCV 85.5 80.0 - 100.0 fL   MCH 29.7 26.0 - 34.0 pg   MCHC 34.8 30.0 - 36.0 g/dL   RDW 08.6 57.8 - 46.9 %   Platelets 244 150 - 400 K/uL   nRBC 0.0 0.0 - 0.2 %  Comprehensive metabolic panel      Status: Abnormal   Collection Time: 10/04/20  9:38 AM  Result Value Ref  Range   Sodium 135 135 - 145 mmol/L   Potassium 3.6 3.5 - 5.1 mmol/L   Chloride 104 98 - 111 mmol/L   CO2 20 (L) 22 - 32 mmol/L   Glucose, Bld 103 (H) 70 - 99 mg/dL   BUN 9 6 - 20 mg/dL   Creatinine, Ser 2.87 (L) 0.44 - 1.00 mg/dL   Calcium 8.8 (L) 8.9 - 10.3 mg/dL   Total Protein 6.3 (L) 6.5 - 8.1 g/dL   Albumin 3.9 3.5 - 5.0 g/dL   AST 15 15 - 41 U/L   ALT 13 0 - 44 U/L   Alkaline Phosphatase 29 (L) 38 - 126 U/L   Total Bilirubin 0.5 0.3 - 1.2 mg/dL   GFR, Estimated >68 >11 mL/min   Anion gap 11 5 - 15   Meds ordered this encounter  Medications  . lactated ringers bolus 1,000 mL  . ondansetron (ZOFRAN) injection 4 mg  . promethazine (PHENERGAN) 12.5 MG tablet    Sig: Take 1 tablet (12.5 mg total) by mouth every 6 (six) hours as needed for nausea or vomiting.    Dispense:  30 tablet    Refill:  0    Order Specific Question:   Supervising Provider    Answer:   [place] Bing [id number]  . ondansetron (ZOFRAN ODT) 4 MG disintegrating tablet    Sig: Take 1 tablet (4 mg total) by mouth every 6 (six) hours as needed for nausea.    Dispense:  20 tablet    Refill:  0    Order Specific Question:   Supervising Provider    Answer:   [place] Bing [[id number]]   Assessment and Plan  --30 y.o. G2P0010 with live IUP at [redacted]w[redacted]d  --Nausea and vomiting in first trimester --No ketonuria --Antiemetic regimen revised, tolerating PO prior to discharge --Discharge home in stable condition  Calvert Cantor, CNM 10/04/2020, 3:15 PM

## 2020-10-04 NOTE — MAU Note (Signed)
Patient reports nonstop vomiting since 0430,  States she has been having ongoing N/V for the past couple weeks but since Saturday it has gotten worse.  Reports she is on meclizine w/o relief.  Reports she is [redacted] weeks pregnant.  Denies VB.  Some cramping-reports this is an IVF pregnancy.

## 2020-10-04 NOTE — Discharge Instructions (Signed)
Nausea and Vomiting in Pregnancy What are the causes? The cause of this condition is not known. It may be associated with:  Changes in hormones in the body during pregnancy.  Changes in the gastrointestinal system.  Genetic or inherited conditions. What are the signs or symptoms? Symptoms of this condition include:  Severe nausea and vomiting that does not go away.  Problems keeping food down.  Weight loss.  Loss of body fluid (dehydration).  Loss of appetite. You may have no desire to eat or you may not like the food you have previously enjoyed. How is this diagnosed? This condition may be diagnosed based on your medical history, your symptoms, and a physical exam. You may also have other tests, including:  Blood tests.  Urine tests.  Blood pressure tests.  Ultrasound to look for problems with the placenta or to check if you are pregnant with more than one baby. How is this treated? This condition is managed by controlling symptoms. This may include:  Following an eating plan. This can help to lessen nausea and vomiting.  Treatments that do not use medicine. These include acupressure bracelets, hypnosis, and eating or drinking foods or fluids that contain ginger, ginger ale, or ginger tea.  Taking prescription medicine or over-the-counter medicine as told by your health care provider.  Continuing to take prenatal vitamins. You may need to change what kind you take and when you take them. Follow your health care provider's instructions about prenatal vitamins. An eating plan and medicines are often used together to help control symptoms. If medicines do not help relieve nausea and vomiting, you may need to receive fluids through an IV at the hospital. Follow these instructions at home: To help relieve your symptoms, listen to your body. Everyone is different and has different preferences. Find what works best for you. Here are some things you can try to help relieve your  symptoms: Meals and snacks  Eat 5-6 small meals daily instead of 3 large meals. Eating small meals and snacks can help you avoid an empty stomach.  Before getting out of bed, eat a couple of crackers to avoid moving around on an empty stomach.  Eat a protein-rich snack before bed. Examples include cheese and crackers, or a peanut butter sandwich made with 1 slice of whole-wheat bread and 1 tsp (5 g) of peanut butter.  Eat and drink slowly.  Try eating starchy foods as these are usually tolerated well. Examples include cereal, toast, bread, potatoes, pasta, rice, and pretzels.  Eat at least one serving of protein with your meals and snacks. Protein options include lean meats, poultry, seafood, beans, nuts, nut butters, eggs, cheese, and yogurt.  Eat or suck on things that have ginger in them. It may help to relieve nausea. Add  tsp (0.44 g) ground ginger to hot tea, or choose ginger tea.   Fluids It is important to stay hydrated. Try to:  Drink small amounts of fluids often.  Drink fluids 30 minutes before or after a meal to help lessen the feeling of a full stomach.  Drink 100% fruit juice or an electrolyte drink. An electrolyte drink contains sodium, potassium, and chloride.  Drink fluids that are cold, clear, and carbonated or sour. These include lemonade, ginger ale, lemon-lime soda, ice water, and sparkling water. Things to avoid Avoid the following:  Eating foods that trigger your symptoms. These may include spicy foods, coffee, high-fat foods, very sweet foods, and acidic foods.  Drinking more than 1 cup  of fluid at a time.  Skipping meals. Nausea can be more intense on an empty stomach. If you cannot tolerate food, do not force it. Try sucking on ice chips or other frozen items and make up for missed calories later.  Lying down within 2 hours after eating.  Being exposed to environmental triggers. These may include food smells, smoky rooms, closed spaces, rooms with  strong smells, warm or humid places, overly loud and noisy rooms, and rooms with motion or flickering lights. Try eating meals in a well-ventilated area that is free of strong smells.  Making quick and sudden changes in your movement.  Taking iron pills and multivitamins that contain iron. If you take prescription iron pills, do not stop taking them unless your health care provider approves.  Preparing food. The smell of food can spoil your appetite or trigger nausea. General instructions  Brush your teeth or use a mouth rinse after meals.  Take over-the-counter and prescription medicines only as told by your health care provider.  Follow instructions from your health care provider about eating or drinking restrictions.  Talk with your health care provider about starting a supplement of vitamin B6.  Continue to take your prenatal vitamins as told by your health care provider. If you are having trouble taking your prenatal vitamins, talk with your health care provider about other options.  Keep all follow-up visits. This is important. Follow-up visits include prenatal visits. Contact a health care provider if:  You have pain in your abdomen.  You have a severe headache.  You have vision problems.  You are losing weight.  You feel weak or dizzy.  You cannot eat or drink without vomiting, especially if this goes on for a full day. Get help right away if:  You cannot drink fluids without vomiting.  You vomit blood.  You have constant nausea and vomiting.  You are very weak.  You faint.  You have a fever and your symptoms suddenly get worse. Summary  Making some changes to your eating habits may help relieve nausea and vomiting.  This condition may be managed with lifestyle changes and medicines as prescribed by your health care provider.  If medicines do not help relieve nausea and vomiting, you may need to receive fluids through an IV at the hospital. This  information is not intended to replace advice given to you by your health care provider. Make sure you discuss any questions you have with your health care provider. Document Revised: 02/29/2020 Document Reviewed: 02/29/2020 Elsevier Patient Education  2021 [employer].

## 2020-10-05 LAB — CULTURE, OB URINE: Culture: NO GROWTH

## 2020-10-17 DIAGNOSIS — Z3401 Encounter for supervision of normal first pregnancy, first trimester: Secondary | ICD-10-CM | POA: Diagnosis not present

## 2020-11-03 DIAGNOSIS — Z361 Encounter for antenatal screening for raised alphafetoprotein level: Secondary | ICD-10-CM | POA: Diagnosis not present

## 2020-11-17 ENCOUNTER — Encounter: Payer: Self-pay | Admitting: Podiatry

## 2020-11-17 ENCOUNTER — Ambulatory Visit (INDEPENDENT_AMBULATORY_CARE_PROVIDER_SITE_OTHER): Payer: BC Managed Care – PPO | Admitting: Podiatry

## 2020-11-17 ENCOUNTER — Other Ambulatory Visit: Payer: Self-pay

## 2020-11-17 DIAGNOSIS — M722 Plantar fascial fibromatosis: Secondary | ICD-10-CM | POA: Diagnosis not present

## 2020-11-17 NOTE — Patient Instructions (Signed)

## 2020-11-17 NOTE — Progress Notes (Signed)
Subjective:   Patient ID: Janice Vance, female   DOB: 30 y.o.   MRN: [id number]   HPI Patient states that she has been having problems with her heels of over 6 months duration and has gradually become worse over that time and she is now [redacted] weeks pregnant.  States that they get very sore and make it hard for her to walk and she does not smoke and would like to be more active   Review of Systems  All other systems reviewed and are negative.       Objective:  Physical Exam Vitals and nursing note reviewed.  Constitutional:      Appearance: She is well-developed.  Pulmonary:     Effort: Pulmonary effort is normal.  Musculoskeletal:        General: Normal range of motion.  Skin:    General: Skin is warm.  Neurological:     Mental Status: She is alert.     Neurovascular status intact muscle strength adequate range of motion adequate with exquisite discomfort plantar heel region bilateral inflammation fluid of the medial band at the insertional point tendon calcaneus with what appears to be heavy foot walking and heavy heel walk     Assessment:  Probability that were dealing with chronic fasciitis symptomatology and she does have family history of condition bilateral with moderate depression of the arch     Plan:  H&P discussed condition no x-rays done no injection study though they could possibly be done in trimester and her OB does agree in the third trimester this can be done if necessary.  Applied fascial strapping bilateral dispensed 1 night splint discussed possible orthotics and utilize aggressive ice therapy

## 2020-11-28 DIAGNOSIS — Z363 Encounter for antenatal screening for malformations: Secondary | ICD-10-CM | POA: Diagnosis not present

## 2020-12-13 DIAGNOSIS — O09812 Supervision of pregnancy resulting from assisted reproductive technology, second trimester: Secondary | ICD-10-CM | POA: Diagnosis not present

## 2020-12-13 DIAGNOSIS — Z362 Encounter for other antenatal screening follow-up: Secondary | ICD-10-CM | POA: Diagnosis not present

## 2020-12-13 DIAGNOSIS — Z3A21 21 weeks gestation of pregnancy: Secondary | ICD-10-CM | POA: Diagnosis not present

## 2020-12-15 ENCOUNTER — Ambulatory Visit: Payer: BC Managed Care – PPO | Admitting: Podiatry

## 2020-12-26 ENCOUNTER — Encounter: Payer: Self-pay | Admitting: Podiatry

## 2020-12-26 ENCOUNTER — Other Ambulatory Visit: Payer: Self-pay

## 2020-12-26 ENCOUNTER — Ambulatory Visit (INDEPENDENT_AMBULATORY_CARE_PROVIDER_SITE_OTHER): Payer: BC Managed Care – PPO | Admitting: Podiatry

## 2020-12-26 DIAGNOSIS — M722 Plantar fascial fibromatosis: Secondary | ICD-10-CM

## 2020-12-26 MED ORDER — TRIAMCINOLONE ACETONIDE 10 MG/ML IJ SUSP
20.0000 mg | Freq: Once | INTRAMUSCULAR | Status: AC
  Administered 2020-12-26: 20 mg

## 2020-12-26 NOTE — Progress Notes (Signed)
Subjective:   Patient ID: Janice Vance, female   DOB: 30 y.o.   MRN: [id number]   HPI Patient states the pain has gotten worse not better and my OB said it would be okay to get injections and I am desperate for some kind of relief and patient is approximate 14 weeks away from having her baby   ROS      Objective:  Physical Exam  Neurovascular status intact exquisite discomfort plantar fascial band bilateral insertional point into the calcaneus with tenderness that is very pervasive left over right     Assessment:  Acute Planter fasciitis bilateral with exquisite discomfort left over right     Plan:  H&P reviewed condition.  She has received permission for injections and I do think that this would be of benefit and I explained risk of injection she wants to do this and I did sterile prep injected 3 mg dexamethasone Kenalog combination 5 mg Xylocaine bilateral and then applied air fracture walker left to try to reduce all plantar pressure.  Reappoint for [place] to recheck and may require oral medicine but we will try to hold off till later in the pregnancy

## 2021-01-12 DIAGNOSIS — Z3A25 25 weeks gestation of pregnancy: Secondary | ICD-10-CM | POA: Diagnosis not present

## 2021-01-12 DIAGNOSIS — O09812 Supervision of pregnancy resulting from assisted reproductive technology, second trimester: Secondary | ICD-10-CM | POA: Diagnosis not present

## 2021-01-14 DIAGNOSIS — J028 Acute pharyngitis due to other specified organisms: Secondary | ICD-10-CM | POA: Diagnosis not present

## 2021-01-14 DIAGNOSIS — Z20822 Contact with and (suspected) exposure to covid-19: Secondary | ICD-10-CM | POA: Diagnosis not present

## 2021-01-24 DIAGNOSIS — Z3A27 27 weeks gestation of pregnancy: Secondary | ICD-10-CM | POA: Diagnosis not present

## 2021-01-24 DIAGNOSIS — Z3689 Encounter for other specified antenatal screening: Secondary | ICD-10-CM | POA: Diagnosis not present

## 2021-01-24 DIAGNOSIS — O26842 Uterine size-date discrepancy, second trimester: Secondary | ICD-10-CM | POA: Diagnosis not present

## 2021-01-30 DIAGNOSIS — Z3A28 28 weeks gestation of pregnancy: Secondary | ICD-10-CM | POA: Diagnosis not present

## 2021-01-30 DIAGNOSIS — O360131 Maternal care for anti-D [Rh] antibodies, third trimester, fetus 1: Secondary | ICD-10-CM | POA: Diagnosis not present

## 2021-01-30 DIAGNOSIS — Z3689 Encounter for other specified antenatal screening: Secondary | ICD-10-CM | POA: Diagnosis not present

## 2021-01-30 DIAGNOSIS — O99019 Anemia complicating pregnancy, unspecified trimester: Secondary | ICD-10-CM | POA: Diagnosis not present

## 2021-01-30 DIAGNOSIS — O99013 Anemia complicating pregnancy, third trimester: Secondary | ICD-10-CM | POA: Diagnosis not present

## 2021-01-31 ENCOUNTER — Encounter: Payer: Self-pay | Admitting: Family

## 2021-01-31 ENCOUNTER — Other Ambulatory Visit: Payer: Self-pay

## 2021-01-31 ENCOUNTER — Ambulatory Visit (INDEPENDENT_AMBULATORY_CARE_PROVIDER_SITE_OTHER): Payer: BC Managed Care – PPO | Admitting: Family

## 2021-01-31 VITALS — BP 134/80 | HR 103 | Temp 98.0°F | Ht 71.0 in | Wt 207.0 lb

## 2021-01-31 DIAGNOSIS — J019 Acute sinusitis, unspecified: Secondary | ICD-10-CM

## 2021-01-31 NOTE — Progress Notes (Signed)
Janice Vance is a 30 y.o. female with the following history as recorded in EpicCare:  There are no problems to display for this patient.   Current Outpatient Medications  Medication Sig Dispense Refill   ondansetron (ZOFRAN ODT) 4 MG disintegrating tablet Take 1 tablet (4 mg total) by mouth every 6 (six) hours as needed for nausea. 20 tablet 0   Prenatal Vit-Fe Fumarate-FA (MULTIVITAMIN-PRENATAL) 27-0.8 MG TABS tablet Take 1 tablet by mouth daily at 12 noon.     promethazine (PHENERGAN) 12.5 MG tablet Take 1 tablet (12.5 mg total) by mouth every 6 (six) hours as needed for nausea or vomiting. 30 tablet 0   azithromycin (ZITHROMAX) 250 MG tablet Take 250 mg by mouth as directed.     ferrous sulfate 325 (65 FE) MG tablet TAKE 1 TABLET BY MOUTH EVERY DAY WITH BREAKFAST (Patient not taking: Reported on 01/31/2021) 90 tablet 1   progesterone 200 MG SUPP 200 mg at bedtime. (Patient not taking: Reported on 01/31/2021)     No current facility-administered medications for this visit.    Allergies: Codeine and Penicillins  Past Medical History:  Diagnosis Date   Chicken pox    as a child   Frequent headaches    History of UTI    Hyperlipemia     No past surgical history on file.  Family History  Problem Relation Age of Onset   Breast cancer Father    Diabetes Father    Heart attack Paternal Uncle    Supraventricular tachycardia Maternal Grandmother    Heart failure Maternal Grandmother    Supraventricular tachycardia Paternal Grandmother    Heart attack Paternal Grandfather     Social History   Tobacco Use   Smoking status: Never   Smokeless tobacco: Never  Substance Use Topics   Alcohol use: No    Subjective:  Cough/ congestion x 4 weeks- feels like progressed into infection; is [redacted] weeks pregnant- started Z-pak per her OB yesterday; OB also called in Flonase for her yesterday; is actually already noticing improvement in her symptoms; using OTC Robitussin;       Objective:  Vitals:   01/31/21 1051  BP: 134/80  Pulse: (!) 103  Temp: 98 F (36.7 C)  SpO2: 99%  Weight: 207 lb (93.9 kg)  Height: 5\' 11"  (1.803 m)    General: Well developed, well nourished, in no acute distress  Skin : Warm and dry.  Head: Normocephalic and atraumatic  Eyes: Sclera and conjunctiva clear; pupils round and reactive to light; extraocular movements intact  Ears: External normal; canals clear; tympanic membranes normal  Oropharynx: Pink, supple. No suspicious lesions  Neck: Supple without thyromegaly, adenopathy  Lungs: Respirations unlabored; clear to auscultation bilaterally without wheeze, rales, rhonchi  CVS exam: normal rate and regular rhythm.  Neurologic: Alert and oriented; speech intact; face symmetrical; moves all extremities well; CNII-XII intact without focal deficit   Assessment:  1. Acute sinusitis, recurrence not specified, unspecified location     Plan:  Patient is already on Z-pak per her OB; can tell improvement within 24 hours; she will use the Flonase prescribed by her OB as well; continue OTC Robitussin and also encouraged to add Claritin; increase fluids, rest and follow up worse, no better.   This visit occurred during the SARS-CoV-2 public health emergency.  Safety protocols were in place, including screening questions prior to the visit, additional usage of staff PPE, and extensive cleaning of exam room while observing appropriate contact time as indicated  for disinfecting solutions.    No follow-ups on file.  No orders of the defined types were placed in this encounter.   Requested Prescriptions    No prescriptions requested or ordered in this encounter

## 2021-02-06 ENCOUNTER — Ambulatory Visit: Payer: BC Managed Care – PPO | Admitting: Podiatry

## 2021-02-23 DIAGNOSIS — O99019 Anemia complicating pregnancy, unspecified trimester: Secondary | ICD-10-CM | POA: Diagnosis not present

## 2021-02-23 DIAGNOSIS — O99013 Anemia complicating pregnancy, third trimester: Secondary | ICD-10-CM | POA: Diagnosis not present

## 2021-02-23 DIAGNOSIS — Z3A31 31 weeks gestation of pregnancy: Secondary | ICD-10-CM | POA: Diagnosis not present

## 2021-03-01 ENCOUNTER — Other Ambulatory Visit: Payer: Self-pay

## 2021-03-01 ENCOUNTER — Non-Acute Institutional Stay (HOSPITAL_COMMUNITY)
Admission: RE | Admit: 2021-03-01 | Discharge: 2021-03-01 | Disposition: A | Discharge status: Home / Self Care | Payer: BC Managed Care – PPO | Source: Ambulatory Visit | Attending: Internal Medicine | Admitting: Internal Medicine

## 2021-03-01 ENCOUNTER — Other Ambulatory Visit: Payer: Self-pay | Admitting: Obstetrics and Gynecology

## 2021-03-01 DIAGNOSIS — Z3A00 Weeks of gestation of pregnancy not specified: Secondary | ICD-10-CM | POA: Insufficient documentation

## 2021-03-01 DIAGNOSIS — O99019 Anemia complicating pregnancy, unspecified trimester: Secondary | ICD-10-CM | POA: Insufficient documentation

## 2021-03-01 DIAGNOSIS — O99013 Anemia complicating pregnancy, third trimester: Secondary | ICD-10-CM

## 2021-03-01 MED ORDER — SODIUM CHLORIDE 0.9 % IV SOLN
INTRAVENOUS | Status: DC | PRN
  Administered 2021-03-01: 250 mL via INTRAVENOUS

## 2021-03-01 MED ORDER — SODIUM CHLORIDE 0.9 % IV SOLN
500.0000 mg | Freq: Once | INTRAVENOUS | Status: AC
  Administered 2021-03-01: 500 mg via INTRAVENOUS
  Filled 2021-03-01: qty 25

## 2021-03-01 MED ORDER — ACETAMINOPHEN 500 MG PO TABS
500.0000 mg | ORAL_TABLET | Freq: Once | ORAL | Status: AC
  Administered 2021-03-01: 500 mg via ORAL
  Filled 2021-03-01: qty 1

## 2021-03-01 MED ORDER — DIPHENHYDRAMINE HCL 25 MG PO CAPS
25.0000 mg | ORAL_CAPSULE | Freq: Once | ORAL | Status: AC
  Administered 2021-03-01: 25 mg via ORAL
  Filled 2021-03-01: qty 1

## 2021-03-01 NOTE — Progress Notes (Signed)
PATIENT CARE CENTER NOTE    Diagnosis: Anemia    Provider: Olivia Mackie, MD   Procedure: Venofer infusion   Note:  Patient arrived for Venofer infusion. Patient's orders had not been received and [facility] notified. Spoke with Sue Lush, RN at [facility] office and received verbal order for Venofer 500 mg with pre medications (Tylenol 500 and Benadryl 25). Orders also placed in Epic. Patient received Venofer infusion (1 of 2) via PIV. Pre meds given per order. Patient tolerated infusion well with no adverse reaction.  Vitals signs stable. Discharge instructions given. Patient to come back in 14 days for second infusion. Alert, oriented and ambulatory at discharge.

## 2021-03-02 DIAGNOSIS — Z3A32 32 weeks gestation of pregnancy: Secondary | ICD-10-CM | POA: Diagnosis not present

## 2021-03-02 DIAGNOSIS — O09813 Supervision of pregnancy resulting from assisted reproductive technology, third trimester: Secondary | ICD-10-CM | POA: Diagnosis not present

## 2021-03-15 ENCOUNTER — Non-Acute Institutional Stay (HOSPITAL_COMMUNITY)
Admission: RE | Admit: 2021-03-15 | Discharge: 2021-03-15 | Disposition: A | Discharge status: Home / Self Care | Payer: BC Managed Care – PPO | Source: Ambulatory Visit | Attending: Internal Medicine | Admitting: Internal Medicine

## 2021-03-15 ENCOUNTER — Other Ambulatory Visit: Payer: Self-pay

## 2021-03-15 DIAGNOSIS — D649 Anemia, unspecified: Secondary | ICD-10-CM | POA: Diagnosis not present

## 2021-03-15 DIAGNOSIS — O99013 Anemia complicating pregnancy, third trimester: Secondary | ICD-10-CM | POA: Insufficient documentation

## 2021-03-15 MED ORDER — SODIUM CHLORIDE 0.9 % IV SOLN
500.0000 mg | INTRAVENOUS | Status: DC
  Administered 2021-03-15: 500 mg via INTRAVENOUS
  Filled 2021-03-15: qty 25

## 2021-03-15 MED ORDER — ACETAMINOPHEN 500 MG PO TABS
500.0000 mg | ORAL_TABLET | ORAL | Status: DC
  Administered 2021-03-15: 500 mg via ORAL
  Filled 2021-03-15: qty 1

## 2021-03-15 MED ORDER — DIPHENHYDRAMINE HCL 25 MG PO CAPS
25.0000 mg | ORAL_CAPSULE | ORAL | Status: DC
  Administered 2021-03-15: 25 mg via ORAL
  Filled 2021-03-15: qty 1

## 2021-03-15 MED ORDER — SODIUM CHLORIDE 0.9 % IV SOLN
INTRAVENOUS | Status: DC | PRN
  Administered 2021-03-15: 250 mL via INTRAVENOUS

## 2021-03-15 NOTE — Progress Notes (Signed)
Patient received IV Venofer 500mg  ( dose 2 of 2) as ordered by MD.  Premedications given Benadryl and Tylenol PO per orders. Tolerated well, vitals stable. Pt declined AVS. Patient alert, oriented, and ambulatory at the time of discharge.

## 2021-03-24 DIAGNOSIS — O99013 Anemia complicating pregnancy, third trimester: Secondary | ICD-10-CM | POA: Diagnosis not present

## 2021-03-24 DIAGNOSIS — Z3685 Encounter for antenatal screening for Streptococcus B: Secondary | ICD-10-CM | POA: Diagnosis not present

## 2021-03-29 DIAGNOSIS — O26843 Uterine size-date discrepancy, third trimester: Secondary | ICD-10-CM | POA: Diagnosis not present

## 2021-03-29 DIAGNOSIS — Z3A36 36 weeks gestation of pregnancy: Secondary | ICD-10-CM | POA: Diagnosis not present

## 2021-04-04 ENCOUNTER — Telehealth (HOSPITAL_COMMUNITY): Payer: Self-pay | Admitting: *Deleted

## 2021-04-04 DIAGNOSIS — Z3A37 37 weeks gestation of pregnancy: Secondary | ICD-10-CM | POA: Diagnosis not present

## 2021-04-04 DIAGNOSIS — O3663X0 Maternal care for excessive fetal growth, third trimester, not applicable or unspecified: Secondary | ICD-10-CM | POA: Diagnosis not present

## 2021-04-04 NOTE — Telephone Encounter (Signed)
Preadmission screen  

## 2021-04-10 ENCOUNTER — Encounter (HOSPITAL_COMMUNITY): Payer: Self-pay | Admitting: *Deleted

## 2021-04-10 ENCOUNTER — Other Ambulatory Visit: Payer: Self-pay | Admitting: Obstetrics and Gynecology

## 2021-04-10 LAB — SARS CORONAVIRUS 2 (TAT 6-24 HRS): SARS Coronavirus 2: NEGATIVE

## 2021-04-11 ENCOUNTER — Other Ambulatory Visit: Payer: Self-pay | Admitting: Obstetrics and Gynecology

## 2021-04-11 DIAGNOSIS — Z3A38 38 weeks gestation of pregnancy: Secondary | ICD-10-CM | POA: Diagnosis not present

## 2021-04-11 DIAGNOSIS — O3663X0 Maternal care for excessive fetal growth, third trimester, not applicable or unspecified: Secondary | ICD-10-CM | POA: Diagnosis not present

## 2021-04-14 ENCOUNTER — Inpatient Hospital Stay (HOSPITAL_COMMUNITY): Payer: BC Managed Care – PPO

## 2021-04-16 ENCOUNTER — Encounter (HOSPITAL_COMMUNITY): Payer: Self-pay | Admitting: Obstetrics and Gynecology

## 2021-04-16 ENCOUNTER — Inpatient Hospital Stay (HOSPITAL_COMMUNITY)
Admission: AD | Admit: 2021-04-16 | Discharge: 2021-04-19 | DRG: 807 | Disposition: A | Discharge status: Home / Self Care | Payer: BC Managed Care – PPO | Attending: Obstetrics and Gynecology | Admitting: Obstetrics and Gynecology

## 2021-04-16 DIAGNOSIS — Z6791 Unspecified blood type, Rh negative: Secondary | ICD-10-CM

## 2021-04-16 DIAGNOSIS — O403XX0 Polyhydramnios, third trimester, not applicable or unspecified: Secondary | ICD-10-CM | POA: Diagnosis not present

## 2021-04-16 DIAGNOSIS — O9902 Anemia complicating childbirth: Secondary | ICD-10-CM | POA: Diagnosis not present

## 2021-04-16 DIAGNOSIS — Z3490 Encounter for supervision of normal pregnancy, unspecified, unspecified trimester: Secondary | ICD-10-CM | POA: Diagnosis present

## 2021-04-16 DIAGNOSIS — Z23 Encounter for immunization: Secondary | ICD-10-CM

## 2021-04-16 DIAGNOSIS — Z3A39 39 weeks gestation of pregnancy: Secondary | ICD-10-CM | POA: Diagnosis not present

## 2021-04-16 DIAGNOSIS — O3663X0 Maternal care for excessive fetal growth, third trimester, not applicable or unspecified: Secondary | ICD-10-CM | POA: Diagnosis not present

## 2021-04-16 DIAGNOSIS — O26893 Other specified pregnancy related conditions, third trimester: Secondary | ICD-10-CM | POA: Diagnosis present

## 2021-04-16 LAB — CBC
HCT: 36.4 % (ref 36.0–46.0)
Hemoglobin: 12.1 g/dL (ref 12.0–15.0)
MCH: 28.8 pg (ref 26.0–34.0)
MCHC: 33.2 g/dL (ref 30.0–36.0)
MCV: 86.7 fL (ref 80.0–100.0)
Platelets: 211 10*3/uL (ref 150–400)
RBC: 4.2 MIL/uL (ref 3.87–5.11)
RDW: 17.2 % — ABNORMAL HIGH (ref 11.5–15.5)
WBC: 7.8 10*3/uL (ref 4.0–10.5)
nRBC: 0 % (ref 0.0–0.2)

## 2021-04-16 LAB — TYPE AND SCREEN
ABO/RH(D): A NEG
Antibody Screen: NEGATIVE

## 2021-04-16 LAB — RPR: RPR Ser Ql: NONREACTIVE

## 2021-04-16 MED ORDER — LIDOCAINE HCL (PF) 1 % IJ SOLN: 30.0000 mL | INTRAMUSCULAR | Status: DC | PRN

## 2021-04-16 MED ORDER — MISOPROSTOL 50MCG HALF TABLET: 50.0000 ug | ORAL_TABLET | ORAL | Status: DC

## 2021-04-16 MED ORDER — LACTATED RINGERS IV SOLN: 500.0000 mL | INTRAVENOUS | Status: DC | PRN

## 2021-04-16 MED ORDER — TERBUTALINE SULFATE 1 MG/ML IJ SOLN: 0.2500 mg | Freq: Once | INTRAMUSCULAR | Status: DC | PRN

## 2021-04-16 MED ORDER — OXYTOCIN-SODIUM CHLORIDE 30-0.9 UT/500ML-% IV SOLN
2.5000 [IU]/h | INTRAVENOUS | Status: DC
  Administered 2021-04-17: 2.5 [IU]/h via INTRAVENOUS
  Filled 2021-04-16: qty 500

## 2021-04-16 MED ORDER — MISOPROSTOL 25 MCG QUARTER TABLET
25.0000 ug | ORAL_TABLET | ORAL | Status: DC
  Administered 2021-04-16 (×3): 25 ug via VAGINAL
  Filled 2021-04-16 (×3): qty 1

## 2021-04-16 MED ORDER — SOD CITRATE-CITRIC ACID 500-334 MG/5ML PO SOLN: 30.0000 mL | ORAL | Status: DC | PRN

## 2021-04-16 MED ORDER — LACTATED RINGERS IV SOLN: INTRAVENOUS | Status: DC

## 2021-04-16 MED ORDER — OXYTOCIN BOLUS FROM INFUSION
333.0000 mL | Freq: Once | INTRAVENOUS | Status: AC
  Administered 2021-04-17: 333 mL via INTRAVENOUS

## 2021-04-16 MED ORDER — OXYTOCIN-SODIUM CHLORIDE 30-0.9 UT/500ML-% IV SOLN
1.0000 m[IU]/min | INTRAVENOUS | Status: DC
Start: 2021-04-16 — End: 2021-04-17
  Administered 2021-04-16: 1 m[IU]/min via INTRAVENOUS

## 2021-04-16 MED ORDER — ONDANSETRON HCL 4 MG/2ML IJ SOLN
4.0000 mg | Freq: Four times a day (QID) | INTRAMUSCULAR | Status: DC | PRN
  Administered 2021-04-16 – 2021-04-17 (×2): 4 mg via INTRAVENOUS
  Filled 2021-04-16 (×2): qty 2

## 2021-04-16 MED ORDER — ACETAMINOPHEN 325 MG PO TABS: 650.0000 mg | ORAL_TABLET | ORAL | Status: DC | PRN

## 2021-04-16 NOTE — Progress Notes (Signed)
Dr. Billy Coast notified of cervical exam 4h post Cytotec. Pt cervix remains unchanged. Per Dr. Billy Coast, give one more dose of Cytotec and reassess. Oxytocin 1x1 overnight and to titrate slowly.

## 2021-04-16 NOTE — H&P (Signed)
Janice Vance is a 30 y.o. female presenting for LGA , poly for IOL at 39wks. OB History     Gravida  2   Para      Term      Preterm      AB  1   Living         SAB  1   IAB      Ectopic      Multiple      Live Births             Past Medical History:  Diagnosis Date   Anemia    Chicken pox    as a child   Frequent headaches    History of UTI    Hyperlipemia    Newborn product of IVF pregnancy    Past Surgical History:  Procedure Laterality Date   egg retrieval     Family History: family history includes Breast cancer in her maternal aunt, maternal grandmother, and paternal grandmother; Diabetes in her father; Heart attack in her paternal grandfather and paternal uncle; Heart failure in her maternal grandmother; Supraventricular tachycardia in her maternal grandmother and paternal grandmother. Social History:  reports that she has never smoked. She has never used smokeless tobacco. She reports that she does not drink alcohol and does not use drugs.     Maternal Diabetes: No Genetic Screening: Normal Maternal Ultrasounds/Referrals: Normal Fetal Ultrasounds or other Referrals:  None Maternal Substance Abuse:  No Significant Maternal Medications:  None Significant Maternal Lab Results:  Group B Strep negative Other Comments:  None  Review of Systems  Constitutional: Negative.   All other systems reviewed and are negative. Maternal Medical History:  Reason for admission: Contractions.   Contractions: Onset was less than 1 hour ago.   Frequency: rare.   Perceived severity is mild.   Fetal activity: Perceived fetal activity is normal.   Last perceived fetal movement was within the past hour.   Prenatal complications: Polyhydramnios.   Prenatal Complications - Diabetes: none.  Dilation: Fingertip Effacement (%): 60 Station: Ballotable Exam by:: Lestine Box, RN Blood pressure 135/82, pulse (!) 106, temperature 97.7 F (36.5 C), temperature  source Oral, resp. rate 16, height 5\' 10"  (1.778 m), weight 101.6 kg, SpO2 97 %. Maternal Exam:  Uterine Assessment: Contraction strength is mild.  Contraction frequency is rare.  Abdomen: Patient reports no abdominal tenderness. Fetal presentation: vertex Introitus: Normal vulva. Normal vagina.  Ferning test: not done.  Nitrazine test: not done. Amniotic fluid character: not assessed. Pelvis: questionable for delivery.   Cervix: Cervix evaluated by digital exam.    Physical Exam Constitutional:      Appearance: Normal appearance. She is normal weight.  HENT:     Head: Normocephalic and atraumatic.  Cardiovascular:     Rate and Rhythm: Normal rate and regular rhythm.  Pulmonary:     Effort: Pulmonary effort is normal.     Breath sounds: Normal breath sounds.  Abdominal:     General: Abdomen is flat.     Palpations: Abdomen is soft.  Genitourinary:    General: Normal vulva.  Musculoskeletal:        General: Normal range of motion.     Cervical back: Normal range of motion and neck supple.  Skin:    General: Skin is dry.  Neurological:     General: No focal deficit present.     Mental Status: She is alert and oriented to person, place, and time.  Psychiatric:  Mood and Affect: Mood normal.        Behavior: Behavior normal.    Prenatal labs: ABO, Rh: --/--/A NEG (08/28 8003) Antibody: NEG (08/28 0718) Rubella:  imm RPR:   neg HBsAg:   neg HIV:   neg GBS:   neg  Assessment/Plan: 39 wk IUP LGA with poly IVF pregnancy IOL   Myrella Fahs J 04/16/2021, 9:55 AM

## 2021-04-16 NOTE — Progress Notes (Signed)
Janice Vance is a 30 y.o. G2P0010 at [redacted]w[redacted]d by LMP admitted for induction of labor due to Ivf and LGA.  Subjective: Mild contractions  Objective: BP 136/87   Pulse 92   Temp 97.9 F (36.6 C) (Oral)   Resp 16   Ht 5\' 10"  (1.778 m)   Wt 101.6 kg   SpO2 98%   BMI 32.13 kg/m  No intake/output data recorded. No intake/output data recorded.  FHT:  FHR: 145 bpm, variability: moderate,  accelerations:  Present,  decelerations:  Absent UC:   irregular, every 4 minutes SVE:   Dilation: Fingertip Effacement (%): 70 Station: -3 Exam by:: [email or web address], RN  Labs: CBC    Component Value Date/Time   WBC 7.8 04/16/2021 0718   RBC 4.20 04/16/2021 0718   HGB 12.1 04/16/2021 0718   HCT 36.4 04/16/2021 0718   PLT 211 04/16/2021 0718   MCV 86.7 04/16/2021 0718   MCH 28.8 04/16/2021 0718   MCHC 33.2 04/16/2021 0718   RDW 17.2 (H) 04/16/2021 0718   LYMPHSABS 1.9 07/01/2018 1547   MONOABS 0.5 07/01/2018 1547   EOSABS 0.1 07/01/2018 1547   BASOSABS 0.0 07/01/2018 1547      Assessment / Plan: IOL for LGA and IVF  Labor: Progressing normally Preeclampsia:  no signs or symptoms of toxicity Fetal Wellbeing:  Category I Pain Control:  Labor support without medications I/D:  n/a Anticipated MOD:   guarded  Bryna Razavi J 04/16/2021, 5:57 PM

## 2021-04-17 ENCOUNTER — Inpatient Hospital Stay (HOSPITAL_COMMUNITY): Payer: BC Managed Care – PPO | Admitting: Anesthesiology

## 2021-04-17 ENCOUNTER — Encounter (HOSPITAL_COMMUNITY): Payer: Self-pay | Admitting: Obstetrics and Gynecology

## 2021-04-17 MED ORDER — BUPIVACAINE HCL (PF) 0.25 % IJ SOLN
INTRAMUSCULAR | Status: DC | PRN
  Administered 2021-04-17: 1 mL via EPIDURAL
  Administered 2021-04-17: 8 mL via EPIDURAL

## 2021-04-17 MED ORDER — FENTANYL CITRATE (PF) 100 MCG/2ML IJ SOLN
INTRAMUSCULAR | Status: DC | PRN
  Administered 2021-04-17: 15 ug via INTRATHECAL

## 2021-04-17 MED ORDER — PHENYLEPHRINE 40 MCG/ML (10ML) SYRINGE FOR IV PUSH (FOR BLOOD PRESSURE SUPPORT)
80.0000 ug | PREFILLED_SYRINGE | INTRAVENOUS | Status: DC | PRN
  Filled 2021-04-17: qty 10

## 2021-04-17 MED ORDER — NALBUPHINE HCL 10 MG/ML IJ SOLN
5.0000 mg | INTRAMUSCULAR | Status: DC | PRN
  Administered 2021-04-17: 5 mg via INTRAVENOUS
  Filled 2021-04-17: qty 1

## 2021-04-17 MED ORDER — EPHEDRINE 5 MG/ML INJ: 10.0000 mg | INTRAVENOUS | Status: DC | PRN

## 2021-04-17 MED ORDER — FENTANYL-BUPIVACAINE-NACL 0.5-0.125-0.9 MG/250ML-% EP SOLN
12.0000 mL/h | EPIDURAL | Status: DC | PRN
Start: 2021-04-17 — End: 2021-04-18
  Administered 2021-04-17: 12 mL/h via EPIDURAL
  Filled 2021-04-17 (×2): qty 250

## 2021-04-17 MED ORDER — LIDOCAINE HCL (PF) 1 % IJ SOLN
INTRAMUSCULAR | Status: DC | PRN
  Administered 2021-04-17: 8 mL via EPIDURAL

## 2021-04-17 MED ORDER — OXYTOCIN-SODIUM CHLORIDE 30-0.9 UT/500ML-% IV SOLN: 1.0000 m[IU]/min | INTRAVENOUS | Status: DC

## 2021-04-17 MED ORDER — LACTATED RINGERS IV SOLN
500.0000 mL | Freq: Once | INTRAVENOUS | Status: AC
  Administered 2021-04-17: 500 mL via INTRAVENOUS

## 2021-04-17 MED ORDER — PHENYLEPHRINE 40 MCG/ML (10ML) SYRINGE FOR IV PUSH (FOR BLOOD PRESSURE SUPPORT): 80.0000 ug | PREFILLED_SYRINGE | INTRAVENOUS | Status: DC | PRN

## 2021-04-17 MED ORDER — FENTANYL CITRATE (PF) 100 MCG/2ML IJ SOLN
INTRAMUSCULAR | Status: AC
  Filled 2021-04-17: qty 2

## 2021-04-17 MED ORDER — DIPHENHYDRAMINE HCL 50 MG/ML IJ SOLN: 12.5000 mg | INTRAMUSCULAR | Status: DC | PRN

## 2021-04-17 NOTE — Anesthesia Preprocedure Evaluation (Signed)
Anesthesia Evaluation  Patient identified by MRN, date of birth, ID band Patient awake    Reviewed: Allergy & Precautions, NPO status , Patient's Chart, lab work & pertinent test results  Airway Mallampati: II  TM Distance: >3 FB Neck ROM: Full    Dental no notable dental hx.    Pulmonary neg pulmonary ROS,    Pulmonary exam normal breath sounds clear to auscultation       Cardiovascular negative cardio ROS Normal cardiovascular exam Rhythm:Regular Rate:Normal     Neuro/Psych  Headaches, negative psych ROS   GI/Hepatic negative GI ROS, Neg liver ROS,   Endo/Other  negative endocrine ROS  Renal/GU negative Renal ROS  negative genitourinary   Musculoskeletal negative musculoskeletal ROS (+)   Abdominal   Peds negative pediatric ROS (+)  Hematology negative hematology ROS (+)   Anesthesia Other Findings   Reproductive/Obstetrics (+) Pregnancy                             Anesthesia Physical Anesthesia Plan  ASA: 2  Anesthesia Plan: Epidural   Post-op Pain Management:    Induction:   PONV Risk Score and Plan: 2  Airway Management Planned: Natural Airway  Additional Equipment:   Intra-op Plan:   Post-operative Plan:   Informed Consent: I have reviewed the patients History and Physical, chart, labs and discussed the procedure including the risks, benefits and alternatives for the proposed anesthesia with the patient or authorized representative who has indicated his/her understanding and acceptance.       Plan Discussed with: Anesthesiologist  Anesthesia Plan Comments:         Anesthesia Quick Evaluation

## 2021-04-17 NOTE — Progress Notes (Signed)
Janice Vance is a 30 y.o. G2P0010 at [redacted]w[redacted]d by LMP admitted for induction of labor due to Ivf and LGA. Discussed r/b/mechanism of action for IUPC placement and pt. Gave verbal consent.   S: Doing well, pain well controlled with epidural. No complaints.  O: Vitals:   04/17/21 1001 04/17/21 1031 04/17/21 1101 04/17/21 1131  BP: 110/60 109/64 111/69 115/72  Pulse: 63 62 65 65  Resp: 17 16 17 16   Temp:      TempSrc:      SpO2:      Weight:      Height:       Pitocin at 12 milliunits   FHT:  FHR: 140 bpm, variability: moderate,  accelerations:  Present,  decelerations:  Present intermittent early, late, and variable decelerations  responding to position changes and fluid boluses  UC:   regular, every 2-4 minutes SVE:   Dilation: 2 Effacement (%): 70 Station: -3 Exam by:: M. Galan Ghee, CNM IUPC placed without difficulty    Assessment / Plan: IOL for LGA and IVF   Labor: Protracted latent phase, slow progress Preeclampsia:  no signs or symptoms of toxicity Fetal Wellbeing:  Category 2 Pain Control:  Labor support without medications I/D:  n/a Anticipated MOD:   guarded  Dr. [email or web address] updated on patient status/plan  Billy Coast, CNM, MSN 04/17/2021, 12:20 PM

## 2021-04-17 NOTE — Progress Notes (Signed)
Yuritzy Zoriyah Scheidegger is a 30 y.o. G2P0010 at [redacted]w[redacted]d by LMP admitted for induction of labor due to Ivf and LGA.  Subjective: More uncomfortable  Objective: BP 129/82   Pulse 68   Temp 98 F (36.7 C)   Resp 16   Ht 5\' 10"  (1.778 m)   Wt 101.6 kg   SpO2 98%   BMI 32.13 kg/m  No intake/output data recorded. No intake/output data recorded.  FHT:  FHR: 145 bpm, variability: moderate,  accelerations:  Present,  decelerations:  Absent UC:   regular q 2-3 SVE:   1-2/70/-1 AROM-clear, copious  Labs: CBC    Component Value Date/Time   WBC 7.8 04/16/2021 0718   RBC 4.20 04/16/2021 0718   HGB 12.1 04/16/2021 0718   HCT 36.4 04/16/2021 0718   PLT 211 04/16/2021 0718   MCV 86.7 04/16/2021 0718   MCH 28.8 04/16/2021 0718   MCHC 33.2 04/16/2021 0718   RDW 17.2 (H) 04/16/2021 0718   LYMPHSABS 1.9 07/01/2018 1547   MONOABS 0.5 07/01/2018 1547   EOSABS 0.1 07/01/2018 1547   BASOSABS 0.0 07/01/2018 1547      Assessment / Plan: IOL for LGA and IVF Prolonged latent phase s/p cytotec x 3 now on pitocin  Labor: sow progression Preeclampsia:  no signs or symptoms of toxicity Fetal Wellbeing:  Category I Pain Control:  Labor support without medications, awaiting epidural I/D:  n/a Anticipated MOD:   guarded  Janelli Welling J 04/17/2021, 8:12 AM

## 2021-04-17 NOTE — Anesthesia Procedure Notes (Signed)
Epidural Patient location during procedure: OB Start time: 04/17/2021 8:17 AM End time: 04/17/2021 8:30 AM  Staffing Anesthesiologist: Mellody Dance, MD Performed: anesthesiologist   Preanesthetic Checklist Completed: patient identified, IV checked, site marked, risks and benefits discussed, monitors and equipment checked, pre-op evaluation and timeout performed  Epidural Patient position: sitting Prep: DuraPrep Patient monitoring: heart rate, cardiac monitor, continuous pulse ox and blood pressure Approach: midline Location: L2-L3 Injection technique: LOR saline  Needle:  Needle type: Tuohy  Needle gauge: 17 G Needle length: 9 cm Needle insertion depth: 5 cm Catheter type: closed end flexible Catheter size: 20 Guage Catheter at skin depth: 10 cm Test dose: negative and Other  Assessment Events: blood not aspirated, injection not painful, no injection resistance and negative IV test  Additional Notes Informed consent obtained prior to proceeding including risk of failure, 1% risk of PDPH, risk of minor discomfort and bruising.  Discussed rare but serious complications including epidural abscess, permanent nerve injury, epidural hematoma.  Discussed alternatives to epidural analgesia and patient desires to proceed.  Timeout performed pre-procedure verifying patient name, procedure, and platelet count.  Patient tolerated procedure well.

## 2021-04-17 NOTE — Anesthesia Procedure Notes (Signed)
Epidural Patient location during procedure: OB Start time: 04/17/2021 7:37 PM End time: 04/17/2021 7:40 PM  Staffing Anesthesiologist: Kaylyn Layer, MD Performed: anesthesiologist   Preanesthetic Checklist Completed: patient identified, IV checked, risks and benefits discussed, monitors and equipment checked, pre-op evaluation and timeout performed  Epidural Patient position: sitting Prep: DuraPrep and site prepped and draped Patient monitoring: heart rate, continuous pulse ox and blood pressure Approach: midline Location: L3-L4 Injection technique: LOR air  Needle:  Needle type: Tuohy  Needle gauge: 17 G Needle length: 9 cm Needle insertion depth: 5 cm Catheter type: closed end flexible Catheter size: 19 Gauge Catheter at skin depth: 10 cm  Assessment Events: blood not aspirated, injection not painful, no injection resistance, no paresthesia and negative IV test  Additional Notes Patient identified. Risks, benefits, and alternatives discussed with patient including but not limited to bleeding, infection, nerve damage, paralysis, failed block, incomplete pain control, headache, blood pressure changes, nausea, vomiting, reactions to medication, itching, and postpartum back pain. Confirmed with bedside nurse the patient's most recent platelet count. Confirmed with patient that they are not currently taking any anticoagulation, have any bleeding history, or any family history of bleeding disorders. Patient expressed understanding and wished to proceed. All questions were answered. Sterile technique was used throughout the entire procedure. Please see nursing notes for vital signs.   Crisp LOR with Tuohy needle on first pass. [place]acre 25g spinal needle introduced through Tuohy with clear CSF return prior to injection of intrathecal medication. Spinal needle withdrawn and epidural catheter threaded easily. Negative aspiration of catheter for heme or CSF prior to starting  epidural infusion. Warning signs of high block given to the patient including shortness of breath, tingling/numbness in hands, complete motor block, or any concerning symptoms with instructions to call for help. Patient was given instructions on fall risk and not to get out of bed. All questions and concerns addressed with instructions to call with any issues or inadequate analgesia.  Patient comfortable with contractions prior to my leaving room. Reason for block:procedure for pain

## 2021-04-17 NOTE — Lactation Note (Signed)
This note was copied from a baby's chart. Lactation Consultation Note  Patient Name: [name] Date: 04/17/2021 Reason for consult: L&D Initial assessment Age:31 hours  L&D consult with 40 minutes old infant and P1 mother. Congratulated family on newborn.  Offered assistance with latch, laid back position. Mother has inverted nipples and areolar edema. Used hand pump for nipple eversion, not achieved but collected ~4 mL of colostrum and spoonfed. Several attempts to latch unsuccessful. Maternal aunt requested a nipple shield. Provided 24-mm NS per request. Noted sub-optimal latch with cheek dimpling.   Infant placed back skin to skin and LC contacted Royden Purl RN to share findings.  Contact LC as needed for feeds/support/concerns/questions. All questions answered at this time.   Maternal Data Has patient been taught Hand Expression?: Yes Does the patient have breastfeeding experience prior to this delivery?: No  Feeding Mother's Current Feeding Choice: Breast Milk  LATCH Score Latch: Repeated attempts needed to sustain latch, nipple held in mouth throughout feeding, stimulation needed to elicit sucking reflex.  Audible Swallowing: None  Type of Nipple: Inverted  Comfort (Breast/Nipple): Filling, red/small blisters or bruises, mild/mod discomfort (lots of edema)  Hold (Positioning): Assistance needed to correctly position infant at breast and maintain latch.  LATCH Score: 3   Lactation Tools Discussed/Used Tools: Pump;Flanges;Nipple Shields Nipple shield size: 24 Flange Size: 24 Breast pump type: Manual Pump Education: Setup, frequency, and cleaning;Milk Storage Reason for Pumping: nipple eversion Pumping frequency: as needed for nipple eversion Pumped volume: 4 mL  Interventions Interventions: Breast feeding basics reviewed;Assisted with latch;Hand pump;Adjust position;Skin to skin;Breast massage;Hand express;Expressed  milk;Education  Discharge Pump: Personal;Manual  Consult Status Consult Status: Follow-up from L&D    Layni Kreamer A Higuera Ancidey 04/17/2021, 11:46 PM

## 2021-04-17 NOTE — Progress Notes (Signed)
Janice Vance is a 30 y.o. G2P0010 at [redacted]w[redacted]d by LMP admitted for induction of labor due to Ivf and LGA.  Subjective: More comfortable, hot spot noted.  Objective: BP 111/63   Pulse (!) 104   Temp 98.3 F (36.8 C) (Oral)   Resp 16   Ht 5\' 10"  (1.778 m)   Wt 101.6 kg   SpO2 98%   BMI 32.13 kg/m  No intake/output data recorded. Total I/O In: -  Out: 800 [Urine:800]  FHT:  FHR: 145 bpm, variability: moderate,  accelerations:  Present,  decelerations:  Absent UC:   regular q 2-3 SVE:   5-6/90/-1 IUPC 11-20-1983  Labs: CBC    Component Value Date/Time   WBC 7.8 04/16/2021 0718   RBC 4.20 04/16/2021 0718   HGB 12.1 04/16/2021 0718   HCT 36.4 04/16/2021 0718   PLT 211 04/16/2021 0718   MCV 86.7 04/16/2021 0718   MCH 28.8 04/16/2021 0718   MCHC 33.2 04/16/2021 0718   RDW 17.2 (H) 04/16/2021 0718   LYMPHSABS 1.9 07/01/2018 1547   MONOABS 0.5 07/01/2018 1547   EOSABS 0.1 07/01/2018 1547   BASOSABS 0.0 07/01/2018 1547      Assessment / Plan: IOL for LGA and IVF Prolonged latent phase s/p cytotec x 3 now on pitocin  Labor: slow but steady progression. Preeclampsia:  no signs or symptoms of toxicity Fetal Wellbeing:  Category 2 Pain Control:  Labor support without medications, awaiting epidural I/D:  n/a Anticipated MOD:   guarded  Ezzie Senat J 04/17/2021, 5:35 PM

## 2021-04-17 NOTE — Progress Notes (Signed)
Janice Vance is a 30 y.o. G2P0010 at [redacted]w[redacted]d by LMP admitted for induction of labor due to Ivf and LGA.  Subjective: More comfortable  Objective: BP 104/62   Pulse 76   Temp 98.5 F (36.9 C) (Oral)   Resp 18   Ht 5\' 10"  (1.778 m)   Wt 101.6 kg   SpO2 98%   BMI 32.13 kg/m  No intake/output data recorded. No intake/output data recorded.  FHT:  FHR: 145 bpm, variability: moderate,  accelerations:  Present,  decelerations:  Absent UC:   regular q 2-3 SVE:   1-2/70/-1 IUPC c/w adequate contractions  Labs: CBC    Component Value Date/Time   WBC 7.8 04/16/2021 0718   RBC 4.20 04/16/2021 0718   HGB 12.1 04/16/2021 0718   HCT 36.4 04/16/2021 0718   PLT 211 04/16/2021 0718   MCV 86.7 04/16/2021 0718   MCH 28.8 04/16/2021 0718   MCHC 33.2 04/16/2021 0718   RDW 17.2 (H) 04/16/2021 0718   LYMPHSABS 1.9 07/01/2018 1547   MONOABS 0.5 07/01/2018 1547   EOSABS 0.1 07/01/2018 1547   BASOSABS 0.0 07/01/2018 1547      Assessment / Plan: IOL for LGA and IVF Prolonged latent phase s/p cytotec x 3 now on pitocin  Labor: slow progression Preeclampsia:  no signs or symptoms of toxicity Fetal Wellbeing:  Category I now noted after IUPC , occ early decel. Pain Control:  Labor support without medications, awaiting epidural I/D:  n/a Anticipated MOD:   guarded  Lindsi Bayliss J 04/17/2021, 2:10 PM

## 2021-04-18 DIAGNOSIS — Z6791 Unspecified blood type, Rh negative: Secondary | ICD-10-CM | POA: Diagnosis not present

## 2021-04-18 LAB — CBC
HCT: 33.7 % — ABNORMAL LOW (ref 36.0–46.0)
Hemoglobin: 11.2 g/dL — ABNORMAL LOW (ref 12.0–15.0)
MCH: 28.9 pg (ref 26.0–34.0)
MCHC: 33.2 g/dL (ref 30.0–36.0)
MCV: 86.9 fL (ref 80.0–100.0)
Platelets: 211 10*3/uL (ref 150–400)
RBC: 3.88 MIL/uL (ref 3.87–5.11)
RDW: 17.3 % — ABNORMAL HIGH (ref 11.5–15.5)
WBC: 13.1 10*3/uL — ABNORMAL HIGH (ref 4.0–10.5)
nRBC: 0 % (ref 0.0–0.2)

## 2021-04-18 MED ORDER — PRENATAL MULTIVITAMIN CH
1.0000 | ORAL_TABLET | Freq: Every day | ORAL | Status: DC
  Administered 2021-04-18: 1 via ORAL
  Filled 2021-04-18: qty 1

## 2021-04-18 MED ORDER — WITCH HAZEL-GLYCERIN EX PADS: 1.0000 "application " | MEDICATED_PAD | CUTANEOUS | Status: DC | PRN

## 2021-04-18 MED ORDER — ONDANSETRON HCL 4 MG/2ML IJ SOLN: 4.0000 mg | INTRAMUSCULAR | Status: DC | PRN

## 2021-04-18 MED ORDER — SENNOSIDES-DOCUSATE SODIUM 8.6-50 MG PO TABS
2.0000 | ORAL_TABLET | Freq: Every day | ORAL | Status: DC
  Administered 2021-04-18: 2 via ORAL
  Filled 2021-04-18: qty 2

## 2021-04-18 MED ORDER — IBUPROFEN 600 MG PO TABS
600.0000 mg | ORAL_TABLET | Freq: Four times a day (QID) | ORAL | Status: DC
  Administered 2021-04-18 – 2021-04-19 (×5): 600 mg via ORAL
  Filled 2021-04-18 (×5): qty 1

## 2021-04-18 MED ORDER — OXYCODONE-ACETAMINOPHEN 5-325 MG PO TABS: 2.0000 | ORAL_TABLET | ORAL | Status: DC | PRN

## 2021-04-18 MED ORDER — ONDANSETRON HCL 4 MG PO TABS: 4.0000 mg | ORAL_TABLET | ORAL | Status: DC | PRN

## 2021-04-18 MED ORDER — DIPHENHYDRAMINE HCL 25 MG PO CAPS: 25.0000 mg | ORAL_CAPSULE | Freq: Four times a day (QID) | ORAL | Status: DC | PRN

## 2021-04-18 MED ORDER — SIMETHICONE 80 MG PO CHEW: 80.0000 mg | CHEWABLE_TABLET | ORAL | Status: DC | PRN

## 2021-04-18 MED ORDER — OXYCODONE-ACETAMINOPHEN 5-325 MG PO TABS
1.0000 | ORAL_TABLET | ORAL | Status: DC | PRN
Start: 2021-04-18 — End: 2021-04-19

## 2021-04-18 MED ORDER — ZOLPIDEM TARTRATE 5 MG PO TABS: 5.0000 mg | ORAL_TABLET | Freq: Every evening | ORAL | Status: DC | PRN

## 2021-04-18 MED ORDER — METHYLERGONOVINE MALEATE 0.2 MG/ML IJ SOLN: 0.2000 mg | INTRAMUSCULAR | Status: DC | PRN

## 2021-04-18 MED ORDER — ACETAMINOPHEN 325 MG PO TABS: 650.0000 mg | ORAL_TABLET | ORAL | Status: DC | PRN

## 2021-04-18 MED ORDER — COCONUT OIL OIL: 1.0000 "application " | TOPICAL_OIL | Status: DC | PRN

## 2021-04-18 MED ORDER — BENZOCAINE-MENTHOL 20-0.5 % EX AERO
1.0000 "application " | INHALATION_SPRAY | CUTANEOUS | Status: DC | PRN
  Administered 2021-04-19: 1 via TOPICAL
  Filled 2021-04-18: qty 56

## 2021-04-18 MED ORDER — METHYLERGONOVINE MALEATE 0.2 MG PO TABS
0.2000 mg | ORAL_TABLET | ORAL | Status: DC | PRN
Start: 2021-04-18 — End: 2021-04-19

## 2021-04-18 MED ORDER — TETANUS-DIPHTH-ACELL PERTUSSIS 5-2.5-18.5 LF-MCG/0.5 IM SUSY
0.5000 mL | PREFILLED_SYRINGE | Freq: Once | INTRAMUSCULAR | Status: AC
  Administered 2021-04-19: 0.5 mL via INTRAMUSCULAR
  Filled 2021-04-18: qty 0.5

## 2021-04-18 MED ORDER — DIBUCAINE (PERIANAL) 1 % EX OINT: 1.0000 "application " | TOPICAL_OINTMENT | CUTANEOUS | Status: DC | PRN

## 2021-04-18 NOTE — Lactation Note (Signed)
This note was copied from a baby's chart. Lactation Consultation Note  Patient Name: Janice Vance [id number] Date: 04/18/2021 Reason for consult: Initial assessment;Term;1st time breastfeeding;Primapara Age:31 hours   P1 mother whose infant is now 39 hours old.  This is a term baby at 39+3 weeks.  Family resting when I arrived.  Baby STS and asleep on mother's chest.  Mother reported that she just settled down.  I will acknowledge mother's request for rest at this time.  Mother has recently attempted to feed, however, baby has been sleepy.  Mother has a manual pump at bedside to help with nipple eversion.  Suggested mother call for latch assistance at her convenience.  Father present.   Maternal Data Has patient been taught Hand Expression?: No Does the patient have breastfeeding experience prior to this delivery?: No  Feeding Mother's Current Feeding Choice: Breast Milk  LATCH Score                    Lactation Tools Discussed/Used    Interventions    Discharge    Consult Status Consult Status: Follow-up Date: 04/19/21 Follow-up type: In-patient    Dora Sims 04/18/2021, 7:32 AM

## 2021-04-18 NOTE — Progress Notes (Addendum)
PPD # 1 S/P NSVD  Live born female  Birth Weight: 8 lb 11.7 oz (3960 g) APGAR: 9, 9  Newborn Delivery   Birth date/time: 04/17/2021 22:18:00 Delivery type: Vaginal, Spontaneous     Baby name: Lilly Delivering provider: Olivia Mackie  Episiotomy:None   Lacerations:2nd degree   Feeding: breast  Pain control at delivery: Epidural   S:  Reports feeling well this morning. Reports right leg is still numb and it's difficult to ambulate without assistance. Breastfeeding going well.              Tolerating PO/No nausea or vomiting             Bleeding is light             Pain controlled with acetaminophen and ibuprofen (OTC)             Up ad lib/ambulatory/voiding without difficulties   O:  A & O x 3, in no apparent distress  Vitals:   04/18/21 0030 04/18/21 0130 04/18/21 0504 04/18/21 0915  BP: 117/79 107/71 105/69 119/74  Pulse: 93 91 98 95  Resp: 16 18 18 17   Temp: 98.1 F (36.7 C) 98.1 F (36.7 C) 98.1 F (36.7 C) 98 F (36.7 C)  TempSrc: Oral Oral Oral Oral  SpO2: 98% 98%    Weight:      Height:       Recent Labs    04/16/21 0718 04/18/21 0506  WBC 7.8 13.1*  HGB 12.1 11.2*  HCT 36.4 33.7*  PLT 211 211    Blood type: --/--/A NEG (08/28 0718)  Rubella:   Immune  I&O: I/O last 3 completed shifts: In: -  Out: 3521 [Urine:3100; Blood:421]          No intake/output data recorded.  Vaccines: TDaP          UTD                    COVID-19 UTD   Gen: AAO x 3, NAD  Abdomen: soft, non-tender, non-distended            Fundus: firm, non-tender, U-1  Perineum: repair intact  Lochia: small  Extremities: no edema, no calf pain or tenderness   A/P:  PPD # 1 30 y.o., 10-31-1974  Principal Problem:   Postpartum care following vaginal delivery 8/29  Doing well - stable status  Routine post partum orders Active Problems:   Encounter for induction of labor   SVD (spontaneous vaginal delivery)   Second degree perineal laceration  Discussed perineal care and comfort  measures.     RhD negative/baby RhD negative  Anticipate discharge tomorrow.   9/29, MSN, CNM 04/18/2021, 10:56 AM

## 2021-04-18 NOTE — Anesthesia Postprocedure Evaluation (Signed)
Anesthesia Post Note  Patient: Janice Vance  Procedure(s) Performed: AN AD HOC LABOR EPIDURAL     Patient location during evaluation: Mother Baby Anesthesia Type: Epidural Level of consciousness: awake and alert and oriented Pain management: satisfactory to patient Vital Signs Assessment: post-procedure vital signs reviewed and stable Respiratory status: respiratory function stable Cardiovascular status: stable Postop Assessment: no headache, no backache, epidural receding, patient able to bend at knees, no signs of nausea or vomiting and adequate PO intake Anesthetic complications: no Comments: Epidural receding - her left leg was much more numb than her right leg was during labor and is taking longer to recover. The patient states that she can move both legs and that the numbness in her left leg has receded to her thigh. .   No notable events documented.  Last Vitals:  Vitals:   04/18/21 0130 04/18/21 0504  BP: 107/71 105/69  Pulse: 91 98  Resp: 18 18  Temp: 36.7 C 36.7 C  SpO2: 98%     Last Pain:  Vitals:   04/18/21 0505  TempSrc:   PainSc: 0-No pain   Pain Goal:                   Dione Mccombie

## 2021-04-19 MED ORDER — BENZOCAINE-MENTHOL 20-0.5 % EX AERO: 1.0000 "application " | INHALATION_SPRAY | CUTANEOUS | Status: DC | PRN

## 2021-04-19 MED ORDER — IBUPROFEN 600 MG PO TABS: 600.0000 mg | ORAL_TABLET | Freq: Four times a day (QID) | ORAL | 0 refills | Status: DC

## 2021-04-19 MED ORDER — SENNOSIDES-DOCUSATE SODIUM 8.6-50 MG PO TABS: 2.0000 | ORAL_TABLET | Freq: Every day | ORAL | Status: DC

## 2021-04-19 MED ORDER — ACETAMINOPHEN 500 MG PO TABS: 1000.0000 mg | ORAL_TABLET | Freq: Four times a day (QID) | ORAL | 2 refills | Status: DC | PRN

## 2021-04-19 MED ORDER — COCONUT OIL OIL: 1.0000 "application " | TOPICAL_OIL | 0 refills | Status: DC | PRN

## 2021-04-19 NOTE — Lactation Note (Signed)
This note was copied from a baby's chart. Lactation Consultation Note  Patient Name: Janice Vance [id number] Date: 04/19/2021   Age:31 hours  LC called RN about seeing pt. Prior to their DC.  Client told RN they did not desire an LC visit before leaving.  declined LC.  Maternal Data    Feeding    LATCH Score                    Lactation Tools Discussed/Used    Interventions    Discharge    Consult Status      [name] [facility] 04/19/2021, 11:04 AM

## 2021-04-19 NOTE — Discharge Instructions (Signed)
Lactation outpatient support - home visit   Jessica Bowers, IBCLC (lactation consultant)  & Birth Doula  Phone (text or call): [phone] Email: [email or web address]   Linda Coppola RN, MHA, IBCLC at [facility]: Lactation Consultant  [email or web address] Mail: [email or web address] Tel: [phone]    Additional breastfeeding resources:  [facility] https://ibconline.ca/information-sheets/  [employer] [place]  [email or web address]  Chiropractic specialist   Dr. Leanna Hastings [email or web address]   Craniosacral therapy for baby  Erin Balkind  [email or web address]  

## 2021-04-19 NOTE — Discharge Summary (Signed)
OB Discharge Summary  Patient Name: Janice Vance DOB: 1991/02/06 MRN: [id number]  Date of admission: 04/16/2021 Delivering provider: Olivia Mackie   Admitting diagnosis: Encounter for induction of labor [Z34.90] Intrauterine pregnancy: [redacted]w[redacted]d     Secondary diagnosis: Patient Active Problem List   Diagnosis Date Noted   SVD (spontaneous vaginal delivery) 04/18/2021   Postpartum care following vaginal delivery 8/29 04/18/2021   Second degree perineal laceration 04/18/2021   RhD negative/baby RhD negative 04/18/2021   Encounter for induction of labor 04/16/2021   Additional problems:none   Date of discharge: 04/19/2021   Discharge diagnosis: Principal Problem:   Postpartum care following vaginal delivery 8/29 Active Problems:   Encounter for induction of labor   SVD (spontaneous vaginal delivery)   Second degree perineal laceration   RhD negative/baby RhD negative                                                              Post partum procedures: TDAP vaccine  Augmentation: AROM, Pitocin, and Cytotec Pain control: Epidural  Laceration:2nd degree  Episiotomy:None  Complications: None  Hospital course:  Induction of Labor With Vaginal Delivery   30 y.o. yo G2P1011 at [redacted]w[redacted]d was admitted to the hospital 04/16/2021 for induction of labor.  Indication for induction:  polyhydramnios at term .  Patient had an uncomplicated labor course as follows: Membrane Rupture Time/Date: 8:06 AM ,04/17/2021   Delivery Method:Vaginal, Spontaneous  Episiotomy: None  Lacerations:  2nd degree  Details of delivery can be found in separate delivery note.  Patient had a routine postpartum course. Patient is discharged home 04/19/21.  Newborn Data: Birth date:04/17/2021  Birth time:10:18 PM  Gender:Female  Living status:Living  Apgars:9 ,9  Weight:3960 g   Physical exam  Vitals:   04/18/21 1410 04/18/21 1500 04/18/21 1915 04/19/21 0500  BP: 111/68 111/68 99/75 100/67  Pulse: 91 92  96 75  Resp: 17 18 16 18   Temp: 98.5 F (36.9 C) 98.5 F (36.9 C) (!) 97.4 F (36.3 C) 98 F (36.7 C)  TempSrc: Oral Oral Oral Oral  SpO2: 98% 97% 97% 97%  Weight:      Height:       General: alert, cooperative, and no distress Lochia: appropriate Uterine Fundus: firm Incision: N/A Perineum: repair intact, no edema DVT Evaluation: No cords or calf tenderness. No significant calf/ankle edema. Labs: Lab Results  Component Value Date   WBC 13.1 (H) 04/18/2021   HGB 11.2 (L) 04/18/2021   HCT 33.7 (L) 04/18/2021   MCV 86.9 04/18/2021   PLT 211 04/18/2021   CMP Latest Ref Rng & Units 10/04/2020  Glucose 70 - 99 mg/dL 10/06/2020)  BUN 6 - 20 mg/dL 9  Creatinine 466(Z - 9.93 mg/dL 5.70)  Sodium 1.77(L - 390 mmol/L 135  Potassium 3.5 - 5.1 mmol/L 3.6  Chloride 98 - 111 mmol/L 104  CO2 22 - 32 mmol/L 20(L)  Calcium 8.9 - 10.3 mg/dL 300)  Total Protein 6.5 - 8.1 g/dL 6.3(L)  Total Bilirubin 0.3 - 1.2 mg/dL 0.5  Alkaline Phos 38 - 126 U/L 29(L)  AST 15 - 41 U/L 15  ALT 0 - 44 U/L 13   Edinburgh Postnatal Depression Scale Screening Tool 04/18/2021  I have been able to laugh and see the funny side of things.  0  I have looked forward with enjoyment to things. 0  I have blamed myself unnecessarily when things went wrong. 0  I have been anxious or worried for no good reason. 0  I have felt scared or panicky for no good reason. 0  Things have been getting on top of me. 0  I have been so unhappy that I have had difficulty sleeping. 0  I have felt sad or miserable. 0  I have been so unhappy that I have been crying. 0  The thought of harming myself has occurred to me. 0  Edinburgh Postnatal Depression Scale Total 0   Vaccines: TDaP          prior to DC        COVID-19   UTD  Discharge instruction:  per After Visit Summary,  [facility] booklet and  "Understanding Mother & Baby Care" hospital booklet  After Visit Meds:  Allergies as of 04/19/2021       Reactions   Codeine Rash    Penicillins Rash        Medication List     STOP taking these medications    azithromycin 250 MG tablet Commonly known as: ZITHROMAX   ferrous sulfate 325 (65 FE) MG tablet   ondansetron 4 MG disintegrating tablet Commonly known as: Zofran ODT   progesterone 200 MG Supp   promethazine 12.5 MG tablet Commonly known as: PHENERGAN       TAKE these medications    acetaminophen 500 MG tablet Commonly known as: TYLENOL Take 2 tablets (1,000 mg total) by mouth every 6 (six) hours as needed.   benzocaine-Menthol 20-0.5 % Aero Commonly known as: DERMOPLAST Apply 1 application topically as needed for irritation (perineal discomfort).   coconut oil Oil Apply 1 application topically as needed.   ibuprofen 600 MG tablet Commonly known as: ADVIL Take 1 tablet (600 mg total) by mouth every 6 (six) hours.   multivitamin-prenatal 27-0.8 MG Tabs tablet Take 1 tablet by mouth daily at 12 noon.   senna-docusate 8.6-50 MG tablet Commonly known as: Senokot-S Take 2 tablets by mouth daily.               Discharge Care Instructions  (From admission, onward)           Start     Ordered   04/19/21 0000  Discharge wound care:       Comments: Sitz baths 2 times /day with warm water x 1 week. May add herbals: 1 ounce dried comfrey leaf* 1 ounce calendula flowers 1 ounce lavender flowers  Supplies can be found online at [employer] sources at [employer], [employer]  1/2 ounce dried uva ursi leaves 1/2 ounce witch hazel blossoms (if you can find them) 1/2 ounce dried sage leaf 1/2 cup sea salt Directions: Bring 2 quarts of water to a boil. Turn off heat, and place 1 ounce (approximately 1 large handful) of the above mixed herbs (not the salt) into the pot. Steep, covered, for 30 minutes.  Strain the liquid well with a fine mesh strainer, and discard the herb material. Add 2 quarts of liquid to the tub, along with the 1/2 cup of salt. This medicinal  liquid can also be made into compresses and peri-rinses.   04/19/21 1017            Diet: routine diet  Activity: Advance as tolerated. Pelvic rest for 6 weeks.   Postpartum contraception:  TBD in office  Newborn Data: Live born female  Birth Weight: 8 lb 11.7 oz (3960 g) APGAR: 9, 9  Newborn Delivery   Birth date/time: 04/17/2021 22:18:00 Delivery type: Vaginal, Spontaneous      named Lilly Baby Feeding: Bottle and Breast Disposition:home with mother  Delivery Report:  Review the Delivery Report for details.    Follow up:  Follow-up Information     Olivia Mackie, MD. Schedule an appointment as soon as possible for a visit in 6 week(s).   Specialty: Obstetrics and Gynecology Why: For Postpartum follow-up Contact information: [address] [place] [address] [phone]                   Signed: Neta Mends, CNM, MSN 04/19/2021, 10:18 AM

## 2021-04-29 ENCOUNTER — Telehealth (HOSPITAL_COMMUNITY): Payer: Self-pay

## 2021-04-29 NOTE — Telephone Encounter (Signed)
No answer. Left message to return nurse call.  Marcelino Duster [facility] 04/29/2021,1739

## 2021-06-06 DIAGNOSIS — Z124 Encounter for screening for malignant neoplasm of cervix: Secondary | ICD-10-CM | POA: Diagnosis not present

## 2021-07-14 NOTE — Progress Notes (Addendum)
[facility] [address] [place], [place] [address] [phone]  Date:  07/19/2021   Name:  Janice Vance   DOB:  [birth year]   MRN:  [id number]  PCP:  Pearline Cables, MD    Chief Complaint: Annual Exam (Concerns/ questions: none/Flu shot today: yes)   History of Present Illness:  Janice Vance is a 30 y.o. very pleasant female patient who presents with the following:  Pt seen today for a CPE Last visit with myself 11/21 for a CPE- at that time she was going through IVF, she delivered in late August!   She has a baby girl named Lily who just turned 43 months old.  Baby is doing well, they are formula feeding.  Janice Vance notes that being a first-time mom has plenty  of challenges.  However, she does not feel that she is depressed  Pap- per GYN  Covid booster-recommend Flu vaccine - give today  Can do labs today if she likes  Labs are needed for the insurance form.  She is not fasting currently  Wt Readings from Last 3 Encounters:  07/19/21 196 lb 9.6 oz (89.2 kg)  04/16/21 223 lb 14.4 oz (101.6 kg)  01/31/21 207 lb (93.9 kg)     Patient Active Problem List   Diagnosis Date Noted   SVD (spontaneous vaginal delivery) 04/18/2021    Past Medical History:  Diagnosis Date   Anemia    Chicken pox    as a child   Frequent headaches    History of UTI    Hyperlipemia    Newborn product of IVF pregnancy     Past Surgical History:  Procedure Laterality Date   egg retrieval      Social History   Tobacco Use   Smoking status: Never   Smokeless tobacco: Never  Vaping Use   Vaping Use: Never used  Substance Use Topics   Alcohol use: No   Drug use: No    Family History  Problem Relation Age of Onset   Diabetes Father    Breast cancer Maternal Aunt    Heart attack Paternal Uncle    Breast cancer Maternal Grandmother    Supraventricular tachycardia Maternal Grandmother    Heart failure  Maternal Grandmother    Breast cancer Paternal Grandmother    Supraventricular tachycardia Paternal Grandmother    Heart attack Paternal Grandfather     Allergies  Allergen Reactions   Codeine Rash   Penicillins Rash    Medication list has been reviewed and updated.  Current Outpatient Medications on File Prior to Visit  Medication Sig Dispense Refill   acetaminophen (TYLENOL) 500 MG tablet Take 2 tablets (1,000 mg total) by mouth every 6 (six) hours as needed. 100 tablet 2   benzocaine-Menthol (DERMOPLAST) 20-0.5 % AERO Apply 1 application topically as needed for irritation (perineal discomfort).     coconut oil OIL Apply 1 application topically as needed.  0   ibuprofen (ADVIL) 600 MG tablet Take 1 tablet (600 mg total) by mouth every 6 (six) hours. 30 tablet 0   Prenatal Vit-Fe Fumarate-FA (MULTIVITAMIN-PRENATAL) 27-0.8 MG TABS tablet Take 1 tablet by mouth daily at 12 noon.     senna-docusate (SENOKOT-S) 8.6-50 MG tablet Take 2 tablets by mouth daily.     No current facility-administered medications on file prior to visit.    Review of Systems:  As per HPI- otherwise negative.   Physical  Examination: Vitals:   07/19/21 1451  BP: 112/72  Pulse: 71  Resp: 18  Temp: 98.6 F (37 C)  SpO2: 98%   Vitals:   07/19/21 1451  Weight: 196 lb 9.6 oz (89.2 kg)  Height: 5\' 11"  (1.803 m)   Body mass index is 27.42 kg/m. Ideal Body Weight: Weight in (lb) to have BMI = 25: 178.9  GEN: no acute distress.  Looks well, mildly overweight consistent with postpartum state HEENT: Atraumatic, Normocephalic.  Bilateral TM wnl, oropharynx normal.  PEERL,EOMI.   Ears and Nose: No external deformity. CV: RRR, No M/G/R. No JVD. No thrill. No extra heart sounds. PULM: CTA B, no wheezes, crackles, rhonchi. No retractions. No resp. distress. No accessory muscle use. ABD: S, NT, ND, +BS. No rebound. No HSM. EXTR: No c/c/e PSYCH: Normally interactive. Conversant.    Assessment and  Plan: Physical exam  Screening for deficiency anemia - Plan: CBC  Screening for diabetes mellitus - Plan: Comprehensive metabolic panel, Hemoglobin A1c  Screening for thyroid disorder - Plan: TSH  Screening, lipid - Plan: Lipid panel  Fatigue, unspecified type - Plan: TSH, VITAMIN D 25 Hydroxy (Vit-D Deficiency, Fractures)  Iron deficiency - Plan: Ferritin  Need for influenza vaccination - Plan: Flu Vaccine QUAD 6+ mos PF IM (Fluarix Quad PF)  Physical exam today.  Encouraged healthy diet and exercise routine.  Labs are pending as above, will complete can fax an insurance form once her labs are back Patient has a history of iron deficiency requiring infusion, will check her ferritin level today Gave flu shot  Signed , MD  Addendum 12/1, received her labs as below.  Message to patient Results for orders placed or performed in visit on 07/19/21  CBC  Result Value Ref Range   WBC 4.4 4.0 - 10.5 K/uL   RBC 4.30 3.87 - 5.11 Mil/uL   Platelets 272.0 150.0 - 400.0 K/uL   Hemoglobin 12.8 12.0 - 15.0 g/dL   HCT 07/21/21 27.5 - 17.0 %   MCV 88.1 78.0 - 100.0 fl   MCHC 33.9 30.0 - 36.0 g/dL   RDW 01.7 49.4 - 49.6 %  Comprehensive metabolic panel  Result Value Ref Range   Sodium 142 135 - 145 mEq/L   Potassium 4.1 3.5 - 5.1 mEq/L   Chloride 106 96 - 112 mEq/L   CO2 29 19 - 32 mEq/L   Glucose, Bld 93 70 - 99 mg/dL   BUN 12 6 - 23 mg/dL   Creatinine, Ser 75.9 0.40 - 1.20 mg/dL   Total Bilirubin 0.4 0.2 - 1.2 mg/dL   Alkaline Phosphatase 46 39 - 117 U/L   AST 18 0 - 37 U/L   ALT 22 0 - 35 U/L   Total Protein 6.8 6.0 - 8.3 g/dL   Albumin 4.6 3.5 - 5.2 g/dL   GFR 1.63 846.65 mL/min   Calcium 9.5 8.4 - 10.5 mg/dL  Hemoglobin >99.35  Result Value Ref Range   Hgb A1c MFr Bld 4.9 4.6 - 6.5 %  Lipid panel  Result Value Ref Range   Cholesterol 191 0 - 200 mg/dL   Triglycerides T0V (H) 0.0 - 149.0 mg/dL   HDL 779.3 (L) 90.30 mg/dL   VLDL >09.23 (H) 0.0 - 30.0 mg/dL    Total CHOL/HDL Ratio 5    NonHDL 153.24   TSH  Result Value Ref Range   TSH 1.69 0.35 - 5.50 uIU/mL  VITAMIN D 25 Hydroxy (Vit-D Deficiency, Fractures)  Result Value Ref  Range   VITD 21.21 (L) 30.00 - 100.00 ng/mL  Ferritin  Result Value Ref Range   Ferritin 14.1 10.0 - 291.0 ng/mL  LDL cholesterol, direct  Result Value Ref Range   Direct LDL 131.0 mg/dL

## 2021-07-14 NOTE — Patient Instructions (Addendum)
Good to see you again today- congratulations on your baby girl!   I will be in touch with your labs asap  Flu shot today Please get the latest covid booster at your convenience

## 2021-07-19 ENCOUNTER — Ambulatory Visit (INDEPENDENT_AMBULATORY_CARE_PROVIDER_SITE_OTHER): Payer: BC Managed Care – PPO | Admitting: Family Medicine

## 2021-07-19 VITALS — BP 112/72 | HR 71 | Temp 98.6°F | Resp 18 | Ht 71.0 in | Wt 196.6 lb

## 2021-07-19 DIAGNOSIS — R5383 Other fatigue: Secondary | ICD-10-CM

## 2021-07-19 DIAGNOSIS — Z13220 Encounter for screening for lipoid disorders: Secondary | ICD-10-CM

## 2021-07-19 DIAGNOSIS — Z0000 Encounter for general adult medical examination without abnormal findings: Secondary | ICD-10-CM | POA: Diagnosis not present

## 2021-07-19 DIAGNOSIS — Z131 Encounter for screening for diabetes mellitus: Secondary | ICD-10-CM | POA: Diagnosis not present

## 2021-07-19 DIAGNOSIS — E611 Iron deficiency: Secondary | ICD-10-CM

## 2021-07-19 DIAGNOSIS — Z1329 Encounter for screening for other suspected endocrine disorder: Secondary | ICD-10-CM | POA: Diagnosis not present

## 2021-07-19 DIAGNOSIS — Z130 Encounter for screening for diseases of the blood and blood-forming organs and certain disorders involving the immune mechanism: Secondary | ICD-10-CM | POA: Diagnosis not present

## 2021-07-19 DIAGNOSIS — Z23 Encounter for immunization: Secondary | ICD-10-CM | POA: Diagnosis not present

## 2021-07-20 ENCOUNTER — Encounter: Payer: Self-pay | Admitting: Family Medicine

## 2021-07-20 LAB — LIPID PANEL
Cholesterol: 191 mg/dL (ref 0–200)
HDL: 37.8 mg/dL — ABNORMAL LOW (ref >39.00)
NonHDL: 153.24
Total CHOL/HDL Ratio: 5
Triglycerides: 264 mg/dL — ABNORMAL HIGH (ref 0.0–149.0)
VLDL: 52.8 mg/dL — ABNORMAL HIGH (ref 0.0–40.0)

## 2021-07-20 LAB — COMPREHENSIVE METABOLIC PANEL
ALT: 22 U/L (ref 0–35)
AST: 18 U/L (ref 0–37)
Albumin: 4.6 g/dL (ref 3.5–5.2)
Alkaline Phosphatase: 46 U/L (ref 39–117)
BUN: 12 mg/dL (ref 6–23)
CO2: 29 mEq/L (ref 19–32)
Calcium: 9.5 mg/dL (ref 8.4–10.5)
Chloride: 106 mEq/L (ref 96–112)
Creatinine, Ser: 0.64 mg/dL (ref 0.40–1.20)
GFR: 117.39 mL/min (ref >60.00)
Glucose, Bld: 93 mg/dL (ref 70–99)
Potassium: 4.1 mEq/L (ref 3.5–5.1)
Sodium: 142 mEq/L (ref 135–145)
Total Bilirubin: 0.4 mg/dL (ref 0.2–1.2)
Total Protein: 6.8 g/dL (ref 6.0–8.3)

## 2021-07-20 LAB — VITAMIN D 25 HYDROXY (VIT D DEFICIENCY, FRACTURES): VITD: 21.21 ng/mL — ABNORMAL LOW (ref 30.00–100.00)

## 2021-07-20 LAB — CBC
HCT: 37.9 % (ref 36.0–46.0)
Hemoglobin: 12.8 g/dL (ref 12.0–15.0)
MCHC: 33.9 g/dL (ref 30.0–36.0)
MCV: 88.1 fl (ref 78.0–100.0)
Platelets: 272 10*3/uL (ref 150.0–400.0)
RBC: 4.3 Mil/uL (ref 3.87–5.11)
RDW: 13.2 % (ref 11.5–15.5)
WBC: 4.4 10*3/uL (ref 4.0–10.5)

## 2021-07-20 LAB — TSH: TSH: 1.69 u[IU]/mL (ref 0.35–5.50)

## 2021-07-20 LAB — LDL CHOLESTEROL, DIRECT: Direct LDL: 131 mg/dL

## 2021-07-20 LAB — HEMOGLOBIN A1C: Hgb A1c MFr Bld: 4.9 % (ref 4.6–6.5)

## 2021-07-20 LAB — FERRITIN: Ferritin: 14.1 ng/mL (ref 10.0–291.0)

## 2021-07-31 DIAGNOSIS — D2239 Melanocytic nevi of other parts of face: Secondary | ICD-10-CM | POA: Diagnosis not present

## 2021-07-31 DIAGNOSIS — D485 Neoplasm of uncertain behavior of skin: Secondary | ICD-10-CM | POA: Diagnosis not present

## 2021-08-08 DIAGNOSIS — F419 Anxiety disorder, unspecified: Secondary | ICD-10-CM | POA: Diagnosis not present

## 2021-09-08 DIAGNOSIS — F419 Anxiety disorder, unspecified: Secondary | ICD-10-CM | POA: Diagnosis not present

## 2021-10-11 DIAGNOSIS — F419 Anxiety disorder, unspecified: Secondary | ICD-10-CM | POA: Diagnosis not present

## 2021-10-18 DIAGNOSIS — Z713 Dietary counseling and surveillance: Secondary | ICD-10-CM | POA: Diagnosis not present

## 2021-11-19 DIAGNOSIS — R21 Rash and other nonspecific skin eruption: Secondary | ICD-10-CM | POA: Diagnosis not present

## 2021-11-19 DIAGNOSIS — L500 Allergic urticaria: Secondary | ICD-10-CM | POA: Diagnosis not present

## 2021-11-20 DIAGNOSIS — Z713 Dietary counseling and surveillance: Secondary | ICD-10-CM | POA: Diagnosis not present

## 2021-11-21 DIAGNOSIS — E860 Dehydration: Secondary | ICD-10-CM | POA: Diagnosis not present

## 2021-11-21 DIAGNOSIS — L509 Urticaria, unspecified: Secondary | ICD-10-CM | POA: Diagnosis not present

## 2021-11-21 DIAGNOSIS — R5383 Other fatigue: Secondary | ICD-10-CM | POA: Diagnosis not present

## 2021-11-21 DIAGNOSIS — L299 Pruritus, unspecified: Secondary | ICD-10-CM | POA: Diagnosis not present

## 2021-11-27 ENCOUNTER — Ambulatory Visit (INDEPENDENT_AMBULATORY_CARE_PROVIDER_SITE_OTHER): Payer: BC Managed Care – PPO | Admitting: Allergy and Immunology

## 2021-11-27 ENCOUNTER — Encounter: Payer: Self-pay | Admitting: Allergy and Immunology

## 2021-11-27 VITALS — BP 110/62 | HR 88 | Resp 16 | Ht 69.0 in | Wt 190.0 lb

## 2021-11-27 DIAGNOSIS — L500 Allergic urticaria: Secondary | ICD-10-CM

## 2021-11-27 NOTE — Patient Instructions (Addendum)
?  1.  Loratadine 10 mg -1 tablet 1 time per day ? ?2.  Can increase loratadine to 2 tablets twice a day as maximum dose if needed ? ?3.  Further evaluation? Yes, if recurrent or persistent hives ?

## 2021-11-27 NOTE — Progress Notes (Signed)
?[place] [facility] [employer] - [place] - [place] - [place] ? ? ?Dear Clelia Croft, ? ?Thank you for referring Janice Vance to the [facility] on 11/27/2021.  ? ?Below is a summation of this patient's evaluation and recommendations. ? ?Thank you for your referral. I will keep you informed about this patient's response to treatment.  ? ?If you have any questions please do not hesitate to contact me.  ? ?Sincerely, ? ?Jessica Priest, MD ?Allergy / Immunology ?[facility] ? ? ?______________________________________________________________________ ? ? ? ?NEW PATIENT NOTE ? ?Referring Provider: Hortencia Conradi, NP ?Primary Provider: Galvin Proffer, MD ?Date of office visit: 11/27/2021 ?   ?Subjective:  ? ?Chief Complaint:  Janice Vance (DOB: [birth year]) is a 31 y.o. female who presents to the clinic on 11/27/2021 with a chief complaint of Urticaria ?.    ? ?HPI: Janice Vance presents to this clinic in evaluation of hives. ? ?Approximately 8 days ago she had acute onset of global urticaria manifested as red raised itchy lesions that never healed with scar or hyperpigmentation and would wax and wane from hour to hour without any associated systemic or constitutional symptoms.  She went to the urgent care center and was treated with a steroid shot and then subsequently was evaluated by her medical provider on day 2 of this reaction and given more systemic steroids and had lab work drawn. ? ?As the week has moved forward she has had much less urticaria and in fact now her dermatologic expression of her immune activation is a "red rash" that once again never heals with scar or hyperpigmentation and can resolve within hours.  She has developed a little bit of postnasal drip and scratchy throat over the course of the past 48 hours. ? ?There is no obvious provoking factor giving rise to this immune activation.  She has not  started any new supplements, started any new medications, had a significant environmental change, changed her eating pattern, or has symptoms of an ongoing infectious disease except as described above with her postnasal drip and scratchy throat in the past 48 hours.  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  She is currently having her menstrual period. ? ?She apparently has a history of cold induced "rash" that would affect her kneecaps when she was younger but that has not been a issue recently. ? ?Past Medical History:  ?Diagnosis Date  ? Anemia   ? Chicken pox   ? as a child  ? Frequent headaches   ? History of UTI   ? Hyperlipemia   ? Newborn product of IVF pregnancy   ? ? ?Past Surgical History:  ?Procedure Laterality Date  ? egg retrieval    ? ? ?Allergies as of 11/27/2021   ? ?   Reactions  ? Codeine Rash  ? Penicillins Rash  ? ?  ? ?  ?Medication List  ? ? ?Prenatal Vitamins 28-0.8 MG Tabs ?1 tablet ?  ? ?Review of systems negative except as noted in HPI / PMHx or noted below: ? ?Review of Systems  ?Constitutional: Negative.   ?HENT: Negative.    ?Eyes: Negative.   ?Respiratory: Negative.    ?Cardiovascular: Negative.   ?Gastrointestinal: Negative.   ?Genitourinary: Negative.   ?Musculoskeletal: Negative.   ?Skin: Negative.   ?Neurological: Negative.   ?  Endo/Heme/Allergies: Negative.   ?Psychiatric/Behavioral: Negative.    ? ?Family History  ?Problem Relation Age of Onset  ? Diabetes Father   ? Breast cancer Maternal Aunt   ? Heart attack Paternal Uncle   ? Breast cancer Maternal Grandmother   ? Supraventricular tachycardia Maternal Grandmother   ? Heart failure Maternal Grandmother   ? Asthma Paternal Grandmother   ? Breast cancer Paternal Grandmother   ? Supraventricular tachycardia Paternal Grandmother   ? Heart attack Paternal Grandfather   ? ? ?Social History  ? ?Socioeconomic History  ? Marital  status: Single  ?  Spouse name: Not on file  ? Number of children: Not on file  ? Years of education: Not on file  ? Highest education level: Not on file  ?Occupational History  ? Not on file  ?Tobacco Use  ? Smoking status: Never  ? Smokeless tobacco: Never  ?Vaping Use  ? Vaping Use: Never used  ?Substance and Sexual Activity  ? Alcohol use: No  ? Drug use: No  ? Sexual activity: Yes  ?Other Topics Concern  ? Not on file  ?Social History Narrative  ? Not on file  ? ?Environmental and Social history ? ?Lives in a house with a dry environment, a dog located inside the household, no carpet in the bedroom, plastic on the bed, plastic on the pillow, and no smoking ongoing with inside the household.  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 ? ?Objective:  ? ?Vitals:  ? 11/27/21 0855  ?BP: 110/62  ?Pulse: 88  ?Resp: 16  ?SpO2: 98%  ? ?Height: 5\' 9"  (175.3 cm) ?Weight: 190 lb (86.2 kg) ? ?Physical Exam ?Constitutional:   ?   Appearance: She is not diaphoretic.  ?HENT:  ?   Head: Normocephalic.  ?   Right Ear: Tympanic membrane, ear canal and external ear normal.  ?   Left Ear: Tympanic membrane, ear canal and external ear normal.  ?   Nose: Nose normal. No mucosal edema or rhinorrhea.  ?   Mouth/Throat:  ?   Pharynx: Uvula midline. No oropharyngeal exudate.  ?Eyes:  ?   Conjunctiva/sclera: Conjunctivae normal.  ?Neck:  ?   Thyroid: No thyromegaly.  ?   Trachea: Trachea normal. No tracheal tenderness or tracheal deviation.  ?Cardiovascular:  ?   Rate and Rhythm: Normal rate and regular rhythm.  ?   Heart sounds: Normal heart sounds, S1 normal and S2 normal. No murmur heard. ?Pulmonary:  ?   Effort: No respiratory distress.  ?   Breath sounds: Normal breath sounds. No stridor. No wheezing or rales.  ?Lymphadenopathy:  ?   Head:  ?   Right side of head: No tonsillar adenopathy.  ?   Left side of head: No tonsillar adenopathy.  ?   Cervical: No cervical adenopathy.  ?Skin: ?   Findings: No erythema or  rash.  ?   Nails: There is no clubbing.  ?Neurological:  ?   Mental Status: She is alert.  ? ? ?Diagnostics: Allergy skin tests were not performed.  ? ?Review of blood test viewed from her cell phone performed 6 days ago identifies white blood cell count 4.8, hemoglobin 13.8, platelet 307, lymphocyte 2200, eosinophil 0, basophil 0, creatinine 0.67 mg/DL, IgE less than 2 KU/L, negative aeroallergen and food IgE screening assay, no LFTs performed. ? ?Assessment and Plan:  ? ? ?1. Allergic urticaria   ? ? ?1.  Loratadine 10 mg -1 tablet 1 time per  day ? ?2.  Can increase loratadine to 2 tablets twice a day as maximum dose if needed ? ?3.  Further evaluation? Yes, if recurrent or persistent hives ? ?Janice Vance had some form of immune activation with expression of urticaria with unknown etiologic factor although given the fact that she has now developed some upper respiratory tract symptoms this was most likely a viral trigger.  Because she is doing better at this point we are not going to have her undergo any further evaluation in an attempt to identify other etiologic factors responsible for this issue unless of course this is recurrent or persistent as she moves forward over the course of the next month.  She can take loratadine for the next week or 2 while she still has some degree of immune activation and hopefully this entire process will burnout during that timeframe.  She will keep in contact with me noting her response to this approach. ? ?Jessica Priest, MD ?Allergy / Immunology ?[facility] ?

## 2021-11-28 ENCOUNTER — Encounter: Payer: Self-pay | Admitting: Allergy and Immunology

## 2022-02-08 DIAGNOSIS — Z319 Encounter for procreative management, unspecified: Secondary | ICD-10-CM | POA: Diagnosis not present

## 2022-02-08 DIAGNOSIS — Z3141 Encounter for fertility testing: Secondary | ICD-10-CM | POA: Diagnosis not present

## 2022-02-28 DIAGNOSIS — Z3200 Encounter for pregnancy test, result unknown: Secondary | ICD-10-CM | POA: Diagnosis not present

## 2022-02-28 DIAGNOSIS — Z3201 Encounter for pregnancy test, result positive: Secondary | ICD-10-CM | POA: Diagnosis not present

## 2022-03-02 DIAGNOSIS — N979 Female infertility, unspecified: Secondary | ICD-10-CM | POA: Diagnosis not present

## 2022-03-05 DIAGNOSIS — N979 Female infertility, unspecified: Secondary | ICD-10-CM | POA: Diagnosis not present

## 2022-03-07 DIAGNOSIS — Z8759 Personal history of other complications of pregnancy, childbirth and the puerperium: Secondary | ICD-10-CM | POA: Diagnosis not present

## 2022-03-27 DIAGNOSIS — Z3201 Encounter for pregnancy test, result positive: Secondary | ICD-10-CM | POA: Diagnosis not present

## 2022-04-16 DIAGNOSIS — Z3481 Encounter for supervision of other normal pregnancy, first trimester: Secondary | ICD-10-CM | POA: Diagnosis not present

## 2022-04-16 DIAGNOSIS — Z3689 Encounter for other specified antenatal screening: Secondary | ICD-10-CM | POA: Diagnosis not present

## 2022-04-16 DIAGNOSIS — O418X10 Other specified disorders of amniotic fluid and membranes, first trimester, not applicable or unspecified: Secondary | ICD-10-CM | POA: Diagnosis not present

## 2022-04-16 DIAGNOSIS — Z3A10 10 weeks gestation of pregnancy: Secondary | ICD-10-CM | POA: Diagnosis not present

## 2022-04-16 LAB — OB RESULTS CONSOLE HIV ANTIBODY (ROUTINE TESTING): HIV: NONREACTIVE

## 2022-04-16 LAB — OB RESULTS CONSOLE HEPATITIS B SURFACE ANTIGEN: Hepatitis B Surface Ag: NEGATIVE

## 2022-04-16 LAB — OB RESULTS CONSOLE RPR: RPR: NONREACTIVE

## 2022-04-16 LAB — OB RESULTS CONSOLE RUBELLA ANTIBODY, IGM: Rubella: IMMUNE

## 2022-04-16 LAB — OB RESULTS CONSOLE ABO/RH: RH Type: NEGATIVE

## 2022-05-09 DIAGNOSIS — Z3689 Encounter for other specified antenatal screening: Secondary | ICD-10-CM | POA: Diagnosis not present

## 2022-05-09 DIAGNOSIS — Z118 Encounter for screening for other infectious and parasitic diseases: Secondary | ICD-10-CM | POA: Diagnosis not present

## 2022-05-09 LAB — OB RESULTS CONSOLE GC/CHLAMYDIA
Chlamydia: NEGATIVE
Neisseria Gonorrhea: NEGATIVE

## 2022-06-06 DIAGNOSIS — Z3A17 17 weeks gestation of pregnancy: Secondary | ICD-10-CM | POA: Diagnosis not present

## 2022-06-06 DIAGNOSIS — O99012 Anemia complicating pregnancy, second trimester: Secondary | ICD-10-CM | POA: Diagnosis not present

## 2022-06-06 DIAGNOSIS — Z23 Encounter for immunization: Secondary | ICD-10-CM | POA: Diagnosis not present

## 2022-06-06 DIAGNOSIS — Z361 Encounter for antenatal screening for raised alphafetoprotein level: Secondary | ICD-10-CM | POA: Diagnosis not present

## 2022-06-18 DIAGNOSIS — Z363 Encounter for antenatal screening for malformations: Secondary | ICD-10-CM | POA: Diagnosis not present

## 2022-06-25 ENCOUNTER — Encounter: Payer: Self-pay | Admitting: Family Medicine

## 2022-06-25 ENCOUNTER — Telehealth (INDEPENDENT_AMBULATORY_CARE_PROVIDER_SITE_OTHER): Payer: BC Managed Care – PPO | Admitting: Family Medicine

## 2022-06-25 ENCOUNTER — Telehealth: Payer: Self-pay | Admitting: Internal Medicine

## 2022-06-25 ENCOUNTER — Telehealth: Payer: Self-pay | Admitting: Family Medicine

## 2022-06-25 VITALS — Ht 71.0 in | Wt 189.0 lb

## 2022-06-25 DIAGNOSIS — R051 Acute cough: Secondary | ICD-10-CM

## 2022-06-25 MED ORDER — CEFUROXIME AXETIL 500 MG PO TABS: 500.0000 mg | ORAL_TABLET | Freq: Two times a day (BID) | ORAL | 0 refills | Status: AC

## 2022-06-25 MED ORDER — AZITHROMYCIN 250 MG PO TABS: ORAL_TABLET | ORAL | 0 refills | Status: DC

## 2022-06-25 NOTE — Telephone Encounter (Signed)
Patient said she needs to have another Physician Results Form filled out for her employer for her annual physical. She gets a reduction on her premiums. Patient missed last year's deadline of 11/20 but was told she could use the physical from 07/19/2021. She will fax [place] the form to be completed and resent to employer by 07/09/2022.

## 2022-06-25 NOTE — Progress Notes (Signed)
[facility] [address], [place], [place] [address] [phone]  Date:  06/25/2022   Name:  Janice Vance   DOB:  05/18/1991   MRN:  [id number]  PCP:  Bonnita Nasuti, MD    Chief Complaint: nasty cough (Going on since last Monday and taken day and Nyquil, has not help. /[redacted] weeks pregnant /Test daily at work has been neg. Flu shot 2.5 weeks ago)   History of Present Illness:  Janice Vance is a 31 y.o. very pleasant female patient who presents with the following:  Patient seen today for a MyChart virtual visit Patient location is home, my location is office.  Patient identity confirmed with 2 factors, she gives consent for virtual visit today.  The patient and myself are present on the visit today Last seen by myself about 1 year ago for her physical She is generally doing well, at our visit last year she had delivered her daughter, Janice Vance over the summer  Patient seen today with concern of a cough for about a week She notes chest congestion She is using dayquil and nyquil She is also [redacted] weeks pregnant  She did take a covid test and was negative - she is testing daily She did have a low grade fever to 100.2 a couple of days ago- now resolved Her 50 yo child has mild illness, her husband is also sick  No ST  No ear pain Sx are more in her chest Pregnancy going well so far- she has some fetal movement  She is expecting another daughter   Her GYN is Dr Earlie Raveling about treatment options for bronchitis with pharmD at [facility]- will use ceftin which I confirmed she can tolerate without adverse reaction  She took this medication most recently in 2021  Patient Active Problem List   Diagnosis Date Noted   SVD (spontaneous vaginal delivery) 04/18/2021    Past Medical History:  Diagnosis Date   Anemia    Chicken pox    as a child   Frequent headaches    History of UTI    Hyperlipemia    Newborn  product of IVF pregnancy     Past Surgical History:  Procedure Laterality Date   egg retrieval      Social History   Tobacco Use   Smoking status: Never   Smokeless tobacco: Never  Vaping Use   Vaping Use: Never used  Substance Use Topics   Alcohol use: No   Drug use: No    Family History  Problem Relation Age of Onset   Diabetes Father    Breast cancer Maternal Aunt    Heart attack Paternal Uncle    Breast cancer Maternal Grandmother    Supraventricular tachycardia Maternal Grandmother    Heart failure Maternal Grandmother    Asthma Paternal Grandmother    Breast cancer Paternal Grandmother    Supraventricular tachycardia Paternal Grandmother    Heart attack Paternal Grandfather     Allergies  Allergen Reactions   Codeine Rash   Latex Rash   Penicillins Rash    Medication list has been reviewed and updated.  Current Outpatient Medications on File Prior to Visit  Medication Sig Dispense Refill   Ferrous Sulfate (SLOW FE PO) Take by mouth. Every 3 days     Prenatal Vit-Fe Fumarate-FA (PRENATAL VITAMINS) 28-0.8 MG TABS 1 tablet     No current facility-administered medications on file  prior to visit.    Review of Systems:  As per HPI- otherwise negative.   Physical Examination: There were no vitals filed for this visit. Vitals:   06/25/22 1124  Weight: 189 lb (85.7 kg)  Height: 5\' 11"  (1.803 m)   Body mass index is 26.36 kg/m. Ideal Body Weight: Weight in (lb) to have BMI = 25: 178.9  Pt observed via video monitor- she looks well   Assessment and Plan: Acute cough - Plan: cefUROXime (CEFTIN) 500 MG tablet, DISCONTINUED: azithromycin (ZITHROMAX) 250 MG tablet Virtal visit today for concern of bronchitis Treat with ceftin which pat is know to tolerate She is asked to alert me if not improved in the next few days- Sooner if worse.     Signed Lamar Blinks, MD

## 2022-06-25 NOTE — Telephone Encounter (Signed)
error 

## 2022-06-26 NOTE — Telephone Encounter (Signed)
Have not received form yet. Will continue to look for this.

## 2022-07-11 NOTE — Telephone Encounter (Signed)
Pt stated she thought she had been told we did received this form. She is hoping we can use the one from last year again and fax it to her job before 12/1. She stated if we are unable to, she will have to come in for a cpe and will miss the deadline. Please advise.

## 2022-07-16 NOTE — Telephone Encounter (Signed)
Form was completed and emailed to pt to sign, enter waist circ, and email back. Pt aware and voices understanding.

## 2022-08-06 DIAGNOSIS — Z3689 Encounter for other specified antenatal screening: Secondary | ICD-10-CM | POA: Diagnosis not present

## 2022-08-06 LAB — OB RESULTS CONSOLE ANTIBODY SCREEN: Antibody Screen: NEGATIVE

## 2022-08-20 NOTE — L&D Delivery Note (Signed)
Delivery Note At 4:37 PM a viable and healthy female was delivered via Vaginal, Spontaneous (Presentation: Left Occiput Anterior).  APGAR: 9, 9; weight pending.   Placenta status: Spontaneous, Intact.  Cord: 3 vessels with the following complications: Knot. Tight [place] reduced after delivery with fetal somersault maneuver Cord pH: na  Anesthesia: Epidural Episiotomy: None Lacerations: 2nd degree Suture Repair: 2.0 vicryl rapide Est. Blood Loss (mL): 97  Mom to postpartum.  Baby to Couplet care / Skin to Skin.  Janice Vance 11/04/2022, 4:49 PM

## 2022-10-17 LAB — OB RESULTS CONSOLE GBS: GBS: NEGATIVE

## 2022-10-30 ENCOUNTER — Telehealth (HOSPITAL_COMMUNITY): Payer: Self-pay | Admitting: *Deleted

## 2022-10-30 ENCOUNTER — Encounter (HOSPITAL_COMMUNITY): Payer: Self-pay

## 2022-10-30 NOTE — Telephone Encounter (Signed)
Preadmission screen  

## 2022-10-31 ENCOUNTER — Other Ambulatory Visit: Payer: Self-pay | Admitting: Advanced Practice Midwife

## 2022-10-31 ENCOUNTER — Encounter (HOSPITAL_COMMUNITY): Payer: Self-pay | Admitting: Obstetrics and Gynecology

## 2022-10-31 ENCOUNTER — Inpatient Hospital Stay (HOSPITAL_COMMUNITY)
Admission: AD | Admit: 2022-10-31 | Discharge: 2022-10-31 | Disposition: A | Discharge status: Home / Self Care | Payer: Self-pay | Attending: Obstetrics and Gynecology | Admitting: Obstetrics and Gynecology

## 2022-10-31 ENCOUNTER — Encounter (HOSPITAL_COMMUNITY): Payer: Self-pay | Admitting: *Deleted

## 2022-10-31 ENCOUNTER — Other Ambulatory Visit: Payer: Self-pay | Admitting: Obstetrics and Gynecology

## 2022-10-31 ENCOUNTER — Telehealth (HOSPITAL_COMMUNITY): Payer: Self-pay | Admitting: *Deleted

## 2022-10-31 DIAGNOSIS — N898 Other specified noninflammatory disorders of vagina: Secondary | ICD-10-CM

## 2022-10-31 DIAGNOSIS — O99283 Endocrine, nutritional and metabolic diseases complicating pregnancy, third trimester: Secondary | ICD-10-CM | POA: Insufficient documentation

## 2022-10-31 DIAGNOSIS — O26893 Other specified pregnancy related conditions, third trimester: Secondary | ICD-10-CM | POA: Insufficient documentation

## 2022-10-31 DIAGNOSIS — O471 False labor at or after 37 completed weeks of gestation: Secondary | ICD-10-CM | POA: Insufficient documentation

## 2022-10-31 DIAGNOSIS — Z3689 Encounter for other specified antenatal screening: Secondary | ICD-10-CM

## 2022-10-31 DIAGNOSIS — Z3A39 39 weeks gestation of pregnancy: Secondary | ICD-10-CM | POA: Insufficient documentation

## 2022-10-31 DIAGNOSIS — O480 Post-term pregnancy: Secondary | ICD-10-CM | POA: Insufficient documentation

## 2022-10-31 LAB — POCT FERN TEST: POCT Fern Test: NEGATIVE

## 2022-10-31 NOTE — MAU Note (Signed)
.  Janice Vance is a 32 y.o. at [redacted]w[redacted]d here in MAU reporting: gush of fluid at 1930 clear and mucousy. Patient reports tightening in stomach but is unsure if they are contractions.   Patient reports + FM, denies VB.   Has IOL on Saturday, patient reports GBS -.   SVE on Tuesday was 2.5-3cm  Onset of complaint: 1930 Pain score: 4/10 Vitals:   10/31/22 2014  BP: 123/84  Pulse: (!) 104  Resp: 18  Temp: 97.6 F (36.4 C)  SpO2: 96%     FHT:135 Lab orders placed from triage: labor eval

## 2022-10-31 NOTE — MAU Provider Note (Signed)
Chief Complaint:  Rupture of Membranes   Event Date/Time   First Provider Initiated Contact with Patient 10/31/22 2040      HPI: Janice Vance is a 32 y.o. G3P1011 at 27w2dho presents to maternity admissions reporting Leaking of fluid since 1930.  Reports mild contractions and pelvic pressure She reports good fetal movement, denies vaginal bleeding, vaginal itching/burning, urinary symptoms, h/a, dizziness, n/v, or fever/chills.    Vaginal Discharge The patient's primary symptoms include vaginal discharge. The patient's pertinent negatives include no genital odor, pelvic pain or vaginal bleeding. This is a new problem. The current episode started today. She is pregnant. Pertinent negatives include no back pain, chills, dysuria, fever, nausea or vomiting. The vaginal discharge was clear and thick. There has been no bleeding. She has not been passing clots. She has not been passing tissue. Nothing aggravates the symptoms. She has tried nothing for the symptoms.    Past Medical History: Past Medical History:  Diagnosis Date   Anemia    Chicken pox    as a child   Frequent headaches    History of UTI    Hyperlipemia    Newborn product of IVF pregnancy    POTS (postural orthostatic tachycardia syndrome)     Past obstetric history: OB History  Gravida Para Term Preterm AB Living  '3 1 1   1 1  '$ SAB IAB Ectopic Multiple Live Births  1     0 1    # Outcome Date GA Lbr Len/2nd Weight Sex Delivery Anes PTL Lv  3 Current           2 Term 04/17/21 328w3d3:57 / 01:06 3960 g F Vag-Spont EPI  LIV  1 SAB 2021            Past Surgical History: Past Surgical History:  Procedure Laterality Date   egg retrieval      Family History: Family History  Problem Relation Age of Onset   Diabetes Father    Breast cancer Maternal Aunt    Heart attack Paternal Uncle    Breast cancer Maternal Grandmother    Supraventricular tachycardia Maternal Grandmother    Heart failure Maternal  Grandmother    Asthma Paternal Grandmother    Breast cancer Paternal Grandmother    Supraventricular tachycardia Paternal Grandmother    Heart attack Paternal Grandfather     Social History: Social History   Tobacco Use   Smoking status: Never   Smokeless tobacco: Never  Vaping Use   Vaping Use: Never used  Substance Use Topics   Alcohol use: No   Drug use: No    Allergies:  Allergies  Allergen Reactions   Codeine Rash   Latex Rash   Penicillins Rash    Meds:  No medications prior to admission.    ROS:  Review of Systems  Constitutional:  Negative for chills and fever.  Gastrointestinal:  Negative for nausea and vomiting.  Genitourinary:  Positive for vaginal discharge. Negative for dysuria and pelvic pain.  Musculoskeletal:  Negative for back pain.   I have reviewed patient's Past Medical Hx, Surgical Hx, Family Hx, Social Hx, medications and allergies.   Physical Exam  Patient Vitals for the past 24 hrs:  BP Temp Temp src Pulse Resp SpO2  10/31/22 2014 123/84 97.6 F (36.4 C) Oral (!) 104 18 96 %   Constitutional: Well-developed, well-nourished female in no acute distress.  Cardiovascular: normal rate and rhythm Respiratory: normal effort, no distress GI: Abd soft, non-tender,  gravid appropriate for gestational age.  MS: Extremities nontender, no edema, normal ROM Neurologic: Alert and oriented x 4.  GU: Neg CVAT.  PELVIC EXAM: Cervix pink, without lesion, scant clear mucous discharge, vaginal walls and external genitalia normal      No Pooling, No ferning  Dilation: 3 Effacement (%): 50 Station: -3 Presentation: Vertex Exam by:: Osvaldo Human RN  FHT:  Baseline 140 , moderate variability, accelerations present, no decelerations Contractions: q 4 mins with uterine irritability between   Labs: Results for orders placed or performed during the hospital encounter of 10/31/22 (from the past 24 hour(s))  POCT fern test     Status: Abnormal    Collection Time: 10/31/22  9:03 PM  Result Value Ref Range   POCT Fern Test Negative = intact amniotic membranes     Imaging:  No results found.  MAU Course/MDM: I have Done a speculum exam and fern test which was Negative.  Pooling was negative Nitrazine not done.     Offered observation and recheck of cervix, prefers to go home  Pt stable at time of discharge.  Assessment: Single IUP at 49w2dVaginal discharge in late pregnancy Prodromal contractions, possible early latent labor  Plan: Discharge home Labor precautions and fetal kick counts  Follow-up Information     [facility], [place] Follow up.   Contact information: [id number]               Allergies as of 10/31/2022       Reactions   Codeine Rash   Latex Rash   Penicillins Rash        Medication List     TAKE these medications    Prenatal Vitamins 28-0.8 MG Tabs 1 tablet   SLOW FE PO Take by mouth. Every 3 days        MHansel FeinsteinCNM, MSN Certified Nurse-Midwife 10/31/2022 9:56 PM

## 2022-10-31 NOTE — Telephone Encounter (Signed)
Preadmission screen  

## 2022-11-04 ENCOUNTER — Other Ambulatory Visit: Payer: Self-pay

## 2022-11-04 ENCOUNTER — Inpatient Hospital Stay (HOSPITAL_COMMUNITY): Payer: BLUE CROSS/BLUE SHIELD | Admitting: Anesthesiology

## 2022-11-04 ENCOUNTER — Inpatient Hospital Stay (HOSPITAL_COMMUNITY): Admission: RE | Admit: 2022-11-04 | Payer: Self-pay | Source: Home / Self Care | Admitting: Family Medicine

## 2022-11-04 ENCOUNTER — Encounter (HOSPITAL_COMMUNITY): Payer: Self-pay | Admitting: Obstetrics and Gynecology

## 2022-11-04 ENCOUNTER — Inpatient Hospital Stay (HOSPITAL_COMMUNITY): Payer: Self-pay

## 2022-11-04 ENCOUNTER — Inpatient Hospital Stay (HOSPITAL_COMMUNITY)
Admission: RE | Admit: 2022-11-04 | Discharge: 2022-11-05 | DRG: 807 | Disposition: A | Discharge status: Home / Self Care | Payer: BLUE CROSS/BLUE SHIELD | Attending: Obstetrics and Gynecology | Admitting: Obstetrics and Gynecology

## 2022-11-04 DIAGNOSIS — O9902 Anemia complicating childbirth: Secondary | ICD-10-CM | POA: Diagnosis present

## 2022-11-04 DIAGNOSIS — O3663X0 Maternal care for excessive fetal growth, third trimester, not applicable or unspecified: Secondary | ICD-10-CM | POA: Diagnosis present

## 2022-11-04 DIAGNOSIS — Z87440 Personal history of urinary (tract) infections: Secondary | ICD-10-CM | POA: Diagnosis not present

## 2022-11-04 DIAGNOSIS — Z3A39 39 weeks gestation of pregnancy: Secondary | ICD-10-CM | POA: Diagnosis not present

## 2022-11-04 DIAGNOSIS — Z3490 Encounter for supervision of normal pregnancy, unspecified, unspecified trimester: Secondary | ICD-10-CM | POA: Diagnosis present

## 2022-11-04 DIAGNOSIS — O358XX0 Maternal care for other (suspected) fetal abnormality and damage, not applicable or unspecified: Secondary | ICD-10-CM | POA: Diagnosis present

## 2022-11-04 DIAGNOSIS — O403XX0 Polyhydramnios, third trimester, not applicable or unspecified: Secondary | ICD-10-CM | POA: Diagnosis present

## 2022-11-04 LAB — RPR: RPR Ser Ql: NONREACTIVE

## 2022-11-04 LAB — CBC
HCT: 31.2 % — ABNORMAL LOW (ref 36.0–46.0)
Hemoglobin: 10.6 g/dL — ABNORMAL LOW (ref 12.0–15.0)
MCH: 28.5 pg (ref 26.0–34.0)
MCHC: 34 g/dL (ref 30.0–36.0)
MCV: 83.9 fL (ref 80.0–100.0)
Platelets: 210 10*3/uL (ref 150–400)
RBC: 3.72 MIL/uL — ABNORMAL LOW (ref 3.87–5.11)
RDW: 13.5 % (ref 11.5–15.5)
WBC: 6.6 10*3/uL (ref 4.0–10.5)
nRBC: 0 % (ref 0.0–0.2)

## 2022-11-04 LAB — TYPE AND SCREEN
ABO/RH(D): A NEG
Antibody Screen: POSITIVE

## 2022-11-04 MED ORDER — LACTATED RINGERS IV SOLN: 500.0000 mL | Freq: Once | INTRAVENOUS | Status: DC

## 2022-11-04 MED ORDER — DIPHENHYDRAMINE HCL 50 MG/ML IJ SOLN: 12.5000 mg | INTRAMUSCULAR | Status: DC | PRN

## 2022-11-04 MED ORDER — LACTATED RINGERS IV SOLN: INTRAVENOUS | Status: DC

## 2022-11-04 MED ORDER — ACETAMINOPHEN 325 MG PO TABS: 650.0000 mg | ORAL_TABLET | ORAL | Status: DC | PRN

## 2022-11-04 MED ORDER — EPHEDRINE 5 MG/ML INJ: 10.0000 mg | INTRAVENOUS | Status: DC | PRN

## 2022-11-04 MED ORDER — TERBUTALINE SULFATE 1 MG/ML IJ SOLN: 0.2500 mg | Freq: Once | INTRAMUSCULAR | Status: DC | PRN

## 2022-11-04 MED ORDER — SOD CITRATE-CITRIC ACID 500-334 MG/5ML PO SOLN: 30.0000 mL | ORAL | Status: DC | PRN

## 2022-11-04 MED ORDER — ACETAMINOPHEN 325 MG PO TABS
650.0000 mg | ORAL_TABLET | ORAL | Status: DC | PRN
  Administered 2022-11-04 – 2022-11-05 (×3): 650 mg via ORAL
  Filled 2022-11-04 (×3): qty 2

## 2022-11-04 MED ORDER — FENTANYL-BUPIVACAINE-NACL 0.5-0.125-0.9 MG/250ML-% EP SOLN
12.0000 mL/h | EPIDURAL | Status: DC | PRN
  Administered 2022-11-04: 12 mL/h via EPIDURAL
  Filled 2022-11-04: qty 250

## 2022-11-04 MED ORDER — MISOPROSTOL 25 MCG QUARTER TABLET
25.0000 ug | ORAL_TABLET | ORAL | Status: DC | PRN
  Administered 2022-11-04 (×2): 25 ug via VAGINAL
  Filled 2022-11-04 (×2): qty 1

## 2022-11-04 MED ORDER — IBUPROFEN 600 MG PO TABS
600.0000 mg | ORAL_TABLET | Freq: Four times a day (QID) | ORAL | Status: DC
  Administered 2022-11-04 – 2022-11-05 (×3): 600 mg via ORAL
  Filled 2022-11-04 (×3): qty 1

## 2022-11-04 MED ORDER — ONDANSETRON HCL 4 MG PO TABS: 4.0000 mg | ORAL_TABLET | ORAL | Status: DC | PRN

## 2022-11-04 MED ORDER — SENNOSIDES-DOCUSATE SODIUM 8.6-50 MG PO TABS
2.0000 | ORAL_TABLET | Freq: Every day | ORAL | Status: DC
  Administered 2022-11-05: 2 via ORAL
  Filled 2022-11-04 (×2): qty 2

## 2022-11-04 MED ORDER — LIDOCAINE HCL (PF) 1 % IJ SOLN: 30.0000 mL | INTRAMUSCULAR | Status: DC | PRN

## 2022-11-04 MED ORDER — PHENYLEPHRINE 80 MCG/ML (10ML) SYRINGE FOR IV PUSH (FOR BLOOD PRESSURE SUPPORT): 80.0000 ug | PREFILLED_SYRINGE | INTRAVENOUS | Status: DC | PRN

## 2022-11-04 MED ORDER — OXYTOCIN BOLUS FROM INFUSION
333.0000 mL | Freq: Once | INTRAVENOUS | Status: AC
  Administered 2022-11-04: 333 mL via INTRAVENOUS

## 2022-11-04 MED ORDER — LACTATED RINGERS IV SOLN
500.0000 mL | INTRAVENOUS | Status: DC | PRN
  Administered 2022-11-04: 500 mL via INTRAVENOUS

## 2022-11-04 MED ORDER — DIBUCAINE (PERIANAL) 1 % EX OINT: 1.0000 | TOPICAL_OINTMENT | CUTANEOUS | Status: DC | PRN

## 2022-11-04 MED ORDER — ONDANSETRON HCL 4 MG/2ML IJ SOLN: 4.0000 mg | INTRAMUSCULAR | Status: DC | PRN

## 2022-11-04 MED ORDER — ONDANSETRON HCL 4 MG/2ML IJ SOLN: 4.0000 mg | Freq: Four times a day (QID) | INTRAMUSCULAR | Status: DC | PRN

## 2022-11-04 MED ORDER — TETANUS-DIPHTH-ACELL PERTUSSIS 5-2.5-18.5 LF-MCG/0.5 IM SUSY
0.5000 mL | PREFILLED_SYRINGE | Freq: Once | INTRAMUSCULAR | Status: DC
  Filled 2022-11-04: qty 0.5

## 2022-11-04 MED ORDER — COCONUT OIL OIL: 1.0000 | TOPICAL_OIL | Status: DC | PRN

## 2022-11-04 MED ORDER — DIPHENHYDRAMINE HCL 25 MG PO CAPS: 25.0000 mg | ORAL_CAPSULE | Freq: Four times a day (QID) | ORAL | Status: DC | PRN

## 2022-11-04 MED ORDER — BENZOCAINE-MENTHOL 20-0.5 % EX AERO
1.0000 | INHALATION_SPRAY | CUTANEOUS | Status: DC | PRN
  Administered 2022-11-04: 1 via TOPICAL
  Filled 2022-11-04: qty 56

## 2022-11-04 MED ORDER — OXYTOCIN-SODIUM CHLORIDE 30-0.9 UT/500ML-% IV SOLN
1.0000 m[IU]/min | INTRAVENOUS | Status: DC
  Administered 2022-11-04: 2 m[IU]/min via INTRAVENOUS
  Filled 2022-11-04: qty 500

## 2022-11-04 MED ORDER — ZOLPIDEM TARTRATE 5 MG PO TABS: 5.0000 mg | ORAL_TABLET | Freq: Every evening | ORAL | Status: DC | PRN

## 2022-11-04 MED ORDER — LIDOCAINE HCL (PF) 1 % IJ SOLN
INTRAMUSCULAR | Status: DC | PRN
  Administered 2022-11-04: 11 mL via EPIDURAL

## 2022-11-04 MED ORDER — WITCH HAZEL-GLYCERIN EX PADS: 1.0000 | MEDICATED_PAD | CUTANEOUS | Status: DC | PRN

## 2022-11-04 MED ORDER — METHYLERGONOVINE MALEATE 0.2 MG PO TABS: 0.2000 mg | ORAL_TABLET | ORAL | Status: DC | PRN

## 2022-11-04 MED ORDER — OXYTOCIN-SODIUM CHLORIDE 30-0.9 UT/500ML-% IV SOLN: 2.5000 [IU]/h | INTRAVENOUS | Status: DC

## 2022-11-04 MED ORDER — SIMETHICONE 80 MG PO CHEW: 80.0000 mg | CHEWABLE_TABLET | ORAL | Status: DC | PRN

## 2022-11-04 MED ORDER — METHYLERGONOVINE MALEATE 0.2 MG/ML IJ SOLN: 0.2000 mg | INTRAMUSCULAR | Status: DC | PRN

## 2022-11-04 MED ORDER — PRENATAL MULTIVITAMIN CH
1.0000 | ORAL_TABLET | Freq: Every day | ORAL | Status: DC
  Administered 2022-11-05: 1 via ORAL
  Filled 2022-11-04: qty 1

## 2022-11-04 NOTE — H&P (Signed)
Janice Vance is a 32 y.o. female presenting for IOL for LGA, poly and fetal pyelectasis. OB History     Gravida  3   Para  1   Term  1   Preterm      AB  1   Living  1      SAB  1   IAB      Ectopic      Multiple  0   Live Births  1          Past Medical History:  Diagnosis Date  . Anemia   . Chicken pox    as a child  . Frequent headaches   . History of UTI   . Hyperlipemia   . Newborn product of IVF pregnancy   . POTS (postural orthostatic tachycardia syndrome)    Past Surgical History:  Procedure Laterality Date  . egg retrieval     Family History: family history includes Asthma in her paternal grandmother; Breast cancer in her maternal aunt, maternal grandmother, and paternal grandmother; Diabetes in her father; Heart attack in her paternal grandfather and paternal uncle; Heart failure in her maternal grandmother; Supraventricular tachycardia in her maternal grandmother and paternal grandmother. Social History:  reports that she has never smoked. She has never used smokeless tobacco. She reports that she does not drink alcohol and does not use drugs.     Maternal Diabetes: No Genetic Screening: Normal Maternal Ultrasounds/Referrals: Fetal renal pyelectasis Fetal Ultrasounds or other Referrals:  None Maternal Substance Abuse:  No Significant Maternal Medications:  None Significant Maternal Lab Results:  Group B Strep negative Number of Prenatal Visits:greater than 3 verified prenatal visits Other Comments:  None  Review of Systems  Constitutional: Negative.   All other systems reviewed and are negative.  Maternal Medical History:  Reason for admission: Contractions.   Contractions: Onset was more than 2 days ago.   Frequency: rare.   Perceived severity is mild.   Fetal activity: Perceived fetal activity is normal.   Last perceived fetal movement was within the past hour.   Prenatal complications: Polyhydramnios.   Prenatal  Complications - Diabetes: none.  Dilation: 3 Effacement (%): 50 Station: -3 Exam by:: Earlean Polka, RN Blood pressure 117/79, pulse 69, temperature (!) 97.4 F (36.3 C), temperature source Axillary, resp. rate 16, height 5\' 11"  (1.803 m), weight 97.8 kg, SpO2 97 %, unknown if currently breastfeeding. Maternal Exam:  Introitus: Normal vulva.  Physical Exam Constitutional:      Appearance: Normal appearance.  HENT:     Head: Normocephalic and atraumatic.  Cardiovascular:     Rate and Rhythm: Normal rate and regular rhythm.     Pulses: Normal pulses.     Heart sounds: Normal heart sounds.  Pulmonary:     Effort: Pulmonary effort is normal.     Breath sounds: Normal breath sounds.  Genitourinary:    General: Normal vulva.  Musculoskeletal:        General: Normal range of motion.     Cervical back: Normal range of motion and neck supple.  Skin:    General: Skin is warm and dry.  Neurological:     General: No focal deficit present.     Mental Status: She is alert and oriented to person, place, and time.  Psychiatric:        Mood and Affect: Mood normal.    Prenatal labs: ABO, Rh: --/--/A NEG (03/17 0045) Antibody: POS (03/17 0045) Rubella: Immune (08/28 0000) RPR: NON  REACTIVE (03/17 0101)  HBsAg: Negative (08/28 0000)  HIV: Non-reactive (08/28 0000)  GBS: Negative/-- (02/28 0000)   Assessment/Plan: 39wk IUP LGA with poly Fetal pyelectasis IOL Neonatal sono per peds   Janice Vance 11/04/2022, 8:39 AM

## 2022-11-04 NOTE — Anesthesia Preprocedure Evaluation (Signed)
Anesthesia Evaluation  Patient identified by MRN, date of birth, ID band Patient awake    Reviewed: Allergy & Precautions, NPO status , Patient's Chart, lab work & pertinent test results  Airway Mallampati: II  TM Distance: >3 FB Neck ROM: Full    Dental no notable dental hx.    Pulmonary neg pulmonary ROS   Pulmonary exam normal breath sounds clear to auscultation       Cardiovascular negative cardio ROS Normal cardiovascular exam Rhythm:Regular Rate:Normal     Neuro/Psych  Headaches  negative psych ROS   GI/Hepatic negative GI ROS, Neg liver ROS,,,  Endo/Other  negative endocrine ROS    Renal/GU negative Renal ROS  negative genitourinary   Musculoskeletal negative musculoskeletal ROS (+)    Abdominal   Peds negative pediatric ROS (+)  Hematology negative hematology ROS (+)   Anesthesia Other Findings   Reproductive/Obstetrics (+) Pregnancy                              Anesthesia Physical Anesthesia Plan  ASA: 2  Anesthesia Plan: Epidural   Post-op Pain Management:    Induction:   PONV Risk Score and Plan: 2  Airway Management Planned: Natural Airway  Additional Equipment:   Intra-op Plan:   Post-operative Plan:   Informed Consent: I have reviewed the patients History and Physical, chart, labs and discussed the procedure including the risks, benefits and alternatives for the proposed anesthesia with the patient or authorized representative who has indicated his/her understanding and acceptance.       Plan Discussed with: Anesthesiologist  Anesthesia Plan Comments:          Anesthesia Quick Evaluation

## 2022-11-04 NOTE — Lactation Note (Signed)
This note was copied from a baby's chart. Lactation Consultation Note  Patient Name: Janice Vance [id number] Date: 11/04/2022 Age:33 hours Reason for consult: Initial assessment;Difficult latch;Term  Birth Parent feeding preference is : breast and formula feeding infant.  P2, Per Birth Parent,  she brought her personal NS from home and used it in L&D. Birth Parent using 24 mm NS, Birth Parent attempted to latch infant but infant would not form seal opens mouth wide to latch and would not elicit suck and swallow response. Infant would not suckle on purple and clear slow flow bottle nipple either, infant was spoon fed 8 mls of EBM by spoon that Birth Parent had pumped with hand pump. Birth Parent is using DEBP every 3 hours for 15 minutes to help stimulate and establish her milk supply as she continues to work towards infant learning how to latch at the breast. Birth Parent had formula in room but she decided she will not use it and will give infant her EBM instead. Birth Parent has Breastfeeding Supplemental sheet and knows that if infant latch, her choice she can offer 5-7 mls of EBM but no latch (5-10 mls) per feeding on day 1. Birth Parent will continue to ask RN/LC for latch assistance. Per Birth Parent, she really would like to BF 2nd child, she had latch difficulties with 1st child and stopped after 1 week postpartum. LC discussed infant's input and output, the importance of maternal rest, diet and hydration with Birth Parent. Birth Parent was  made aware of O/P services, breastfeeding support groups, community resources, and our phone # for post-discharge questions.    Birth Parent knows that infant feeding plan may change based on infant feeding behaviors.  Current feeding plan:  1- Birth Parent will pre-pump breast with hand pump prior to applying 24 mm NS and latching infant at the breast, will continue to BF infant 8 to 12+ times within 24 hours, STS. 2- Birth Parent will continue to  ask RN/LC for further latch assistance if needed or help with apply 24 mm NS. 3- Birth Parent will continue to use hand pump and DEBP giving infant back her EBM if infant continues not to latch at breast, day 1 will use spoon or curve tip if infant continues to refuse slow flow bottle nipple. 4- If continue to use 24 mm NS, she will continue to use DEBP every 3 hours for 15 minutes on initial setting.  Maternal Data Has patient been taught Hand Expression?: Yes Does the patient have breastfeeding experience prior to this delivery?: Yes How long did the patient breastfeed?: 1 week due infant not latching at the breast.  Feeding Mother's Current Feeding Choice: Breast Milk and Formula  LATCH Score Latch: Too sleepy or reluctant, no latch achieved, no sucking elicited.  Audible Swallowing: None  Type of Nipple: Inverted  Comfort (Breast/Nipple): Soft / non-tender  Hold (Positioning): Assistance needed to correctly position infant at breast and maintain latch.  LATCH Score: 3   Lactation Tools Discussed/Used Tools: Pump Nipple shield size: 24 Breast pump type: Double-Electric Breast Pump;Manual Pump Education: Setup, frequency, and cleaning Reason for Pumping: Infant not latching at breast, NS use Pumping frequency: every 3 hours for 15 minutes Pumped volume: 8 mL (with hand pump)  Interventions Interventions: Breast feeding basics reviewed;Assisted with latch;Skin to skin;Breast compression;Breast massage;Hand express;Pre-pump if needed;Reverse pressure;Adjust position;Support pillows;Position options;Expressed milk;Hand pump;DEBP;Education;Pace feeding;LC Services brochure  Discharge Pump: DEBP;Hands Free;Personal WIC Program: No  Consult Status Consult Status: Follow-up Date:  11/05/22 Follow-up type: In-patient    Eulis Canner 11/04/2022, 10:30 PM

## 2022-11-04 NOTE — Progress Notes (Signed)
Janice Vance is a 32 y.o. G3P1011 at [redacted]w[redacted]d by LMP admitted for induction of labor due to Hydramnios.  Subjective: Getting uncomfortable  Objective: BP 134/87   Pulse 72   Temp 97.7 F (36.5 C) (Oral)   Resp 18   Ht 5\' 11"  (1.803 m)   Wt 97.8 kg   SpO2 97%   BMI 30.07 kg/m  No intake/output data recorded. No intake/output data recorded.  FHT:  FHR: 135 bpm, variability: moderate,  accelerations:  Present,  decelerations:  Absent UC:   regular, every 3 minutes SVE:   Dilation: 4 Effacement (%): 60 Station: -2, -3 Exam by:: Brenlynn Fake  Labs: Lab Results  Component Value Date   WBC 6.6 11/04/2022   HGB 10.6 (L) 11/04/2022   HCT 31.2 (L) 11/04/2022   MCV 83.9 11/04/2022   PLT 210 11/04/2022    Assessment / Plan: Induction of labor due to poly,  progressing well on pitocin  Labor: Progressing normally Preeclampsia:  no signs or symptoms of toxicity Fetal Wellbeing:  Category I Pain Control:  Labor support without medications I/D:  n/a Anticipated MOD:  NSVD  Lovenia Kim, MD 11/04/2022, 11:43 AM

## 2022-11-04 NOTE — Anesthesia Procedure Notes (Signed)
Epidural Patient location during procedure: OB Start time: 11/04/2022 11:53 AM End time: 11/04/2022 12:06 PM  Staffing Anesthesiologist: Lynda Rainwater, MD Performed: anesthesiologist   Preanesthetic Checklist Completed: patient identified, IV checked, site marked, risks and benefits discussed, surgical consent, monitors and equipment checked, pre-op evaluation and timeout performed  Epidural Patient position: sitting Prep: ChloraPrep Patient monitoring: heart rate, cardiac monitor, continuous pulse ox and blood pressure Approach: midline Location: L2-L3 Injection technique: LOR saline  Needle:  Needle type: Tuohy  Needle gauge: 17 G Needle length: 9 cm Needle insertion depth: 5 cm Catheter type: closed end flexible Catheter size: 20 Guage Catheter at skin depth: 9 cm Test dose: negative  Assessment Events: blood not aspirated, injection not painful, no injection resistance, no paresthesia and negative IV test  Additional Notes Reason for block:procedure for pain

## 2022-11-05 DIAGNOSIS — O9902 Anemia complicating childbirth: Secondary | ICD-10-CM | POA: Diagnosis not present

## 2022-11-05 LAB — CBC
HCT: 30 % — ABNORMAL LOW (ref 36.0–46.0)
Hemoglobin: 9.8 g/dL — ABNORMAL LOW (ref 12.0–15.0)
MCH: 27.8 pg (ref 26.0–34.0)
MCHC: 32.7 g/dL (ref 30.0–36.0)
MCV: 85.2 fL (ref 80.0–100.0)
Platelets: 167 10*3/uL (ref 150–400)
RBC: 3.52 MIL/uL — ABNORMAL LOW (ref 3.87–5.11)
RDW: 13.7 % (ref 11.5–15.5)
WBC: 7.3 10*3/uL (ref 4.0–10.5)
nRBC: 0 % (ref 0.0–0.2)

## 2022-11-05 MED ORDER — POLYSACCHARIDE IRON COMPLEX 150 MG PO CAPS
150.0000 mg | ORAL_CAPSULE | Freq: Every day | ORAL | Status: DC
  Administered 2022-11-05: 150 mg via ORAL
  Filled 2022-11-05: qty 1

## 2022-11-05 MED ORDER — POLYSACCHARIDE IRON COMPLEX 150 MG PO CAPS: 150.0000 mg | ORAL_CAPSULE | Freq: Every day | ORAL | 1 refills | Status: DC

## 2022-11-05 MED ORDER — MAGNESIUM OXIDE -MG SUPPLEMENT 400 (240 MG) MG PO TABS
400.0000 mg | ORAL_TABLET | Freq: Every day | ORAL | Status: DC
  Administered 2022-11-05: 400 mg via ORAL
  Filled 2022-11-05: qty 1

## 2022-11-05 NOTE — Discharge Summary (Signed)
OB Discharge Summary  Patient Name: Janice Vance DOB: 01/25/1991 MRN: [id number]  Date of admission: 11/04/2022 Delivering provider: Brien Few   Admitting diagnosis: Encounter for induction of labor [Z34.90] Intrauterine pregnancy: [redacted]w[redacted]d     Secondary diagnosis: Patient Active Problem List   Diagnosis Date Noted   Maternal anemia, with delivery 11/05/2022   Encounter for induction of labor 11/04/2022   Second degree perineal laceration 11/04/2022   Postpartum care following vaginal delivery 3/17 11/04/2022   SVD (spontaneous vaginal delivery) 04/18/2021    Date of discharge: 11/05/2022   Discharge diagnosis: Principal Problem:   Postpartum care following vaginal delivery 3/17 Active Problems:   SVD (spontaneous vaginal delivery)   Encounter for induction of labor   Second degree perineal laceration   Maternal anemia, with delivery                                                             Augmentation: AROM, Pitocin, and Cytotec Pain control: Epidural  [id number] degree  Complications: None  Hospital course:  Induction of Labor With Vaginal Delivery   32 y.o. yo [id number] at [redacted]w[redacted]d was admitted to the hospital 11/04/2022 for induction of labor.  Indication for induction:  polyhydramnios .  Membrane Rupture Time/Date: 11:19 AM ,11/04/2022   Delivery Method:Vaginal, Spontaneous  Episiotomy: None  Lacerations:  2nd degree  Details of delivery can be found in separate delivery note. Patient had an uncomplicated postpartum course. Patient is discharged home 11/05/22.  Newborn Data: Birth date:11/04/2022  Birth time:4:37 PM  Gender:Female  Living status:Living  Apgars:9 ,9  Weight:3920 g   Physical exam  Vitals:   11/04/22 1925 11/04/22 2334 11/05/22 0336 11/05/22 0735  BP: 115/78 112/74 105/63 110/75  Pulse: 74 74 69 80  Resp: 18 18 18 18   Temp: 98.5 F (36.9 C) 98 F (36.7 C) 98 F (36.7 C) 97.6 F (36.4 C)  TempSrc: Oral Oral Oral Oral  SpO2:  100% 99% 99% 97%  Weight:      Height:       General: alert and cooperative Lochia: appropriate Uterine Fundus: firm Perineum: repair intact, no edema DVT Evaluation: No evidence of DVT seen on physical exam.  Labs: Lab Results  Component Value Date   WBC 7.3 11/05/2022   HGB 9.8 (L) 11/05/2022   HCT 30.0 (L) 11/05/2022   MCV 85.2 11/05/2022   PLT 167 11/05/2022      11/05/2022    7:35 AM 04/18/2021   12:30 AM  Edinburgh Postnatal Depression Scale Screening Tool  I have been able to laugh and see the funny side of things. 0 0  I have looked forward with enjoyment to things. 0 0  I have blamed myself unnecessarily when things went wrong. 0 0  I have been anxious or worried for no good reason. 0 0  I have felt scared or panicky for no good reason. 0 0  Things have been getting on top of me. 0 0  I have been so unhappy that I have had difficulty sleeping. 0 0  I have felt sad or miserable. 0 0  I have been so unhappy that I have been crying. 0 0  The thought of harming myself has occurred to me. 0 0  Edinburgh Postnatal Depression Scale Total 0  0   Discharge instructions:  per After Visit Summary  After Visit Meds:  Allergies as of 11/05/2022       Reactions   Codeine Rash   Latex Rash   Penicillins Rash        Medication List     STOP taking these medications    SLOW FE PO       TAKE these medications    iron polysaccharides 150 MG capsule Commonly known as: Ferrex 150 Take 1 capsule (150 mg total) by mouth daily.   Prenatal Vitamins 28-0.8 MG Tabs 1 tablet       Activity: Advance as tolerated. Pelvic rest for 6 weeks.   Newborn Data: Live born female  Birth Weight: 8 lb 10.3 oz (3920 g) APGAR: 31, 9  Newborn Delivery   Birth date/time: 11/04/2022 16:37:00 Delivery type: Vaginal, Spontaneous     Named Ally Baby Feeding: Breast Disposition:home with mother  Delivery Report:  Review the Delivery Report for details.    Follow up:   Follow-up Information     Brien Few, MD. Schedule an appointment as soon as possible for a visit in 6 week(s).   Specialty: Obstetrics and Gynecology Contact information: [place] [id number] [place], CNM, MSN 11/05/2022, 11:32 AM

## 2022-11-05 NOTE — Lactation Note (Signed)
This note was copied from a baby's chart. Lactation Consultation Note  Patient Name: Janice Vance [id number] Date: 11/05/2022 Age:33 hours Reason for consult: Follow-up assessment;Term (per mom brought a  Nipple shield from home, and for now plans to pump and feed the baby. Per mom the baby has had a bottle and tolerated well.) Per mom whether they can go home later will be reassessed.  LC mentioned as baby hours of age are increasing baby will need more volume per feeding. By 48 hours goal for volume would be 30 ml.  Formula was sitting on the bedside. LC described pace feeding and how it can help the baby open wider for a latch. LC also mentioned when mom is ready to latch the breast with the Nipple shield she can instill EBM or formula into the top of the NS for an appetizer or give the baby and appetizer from the bottle so the baby is latching calm. LC provided the mom with a curved tip syringe and she mentioned she had used it with her 1st baby and breast shells.  LC recommended and LC O/P appt and mom receptive.   Maternal Data    Feeding Mother's Current Feeding Choice: Breast Milk and Formula  LATCH Score - per mom for now just going to pump until the milk comes in.     Lactation Tools Discussed/Used Tools: Pump Nipple shield size: Other (comment) (mom brought the NS from home) Breast pump type: Double-Electric Breast Pump;Manual Pump Education: Milk Storage  Interventions Interventions: Breast feeding basics reviewed;Education;Hand pump;DEBP;LC Services brochure  Discharge Discharge Education: Engorgement and breast care;Outpatient recommendation;Outpatient Epic message sent;Other (comment) (mom aware she will receive a phone call at home.) Pump: Hands Free;DEBP;Manual;Personal  Consult Status Consult Status: Follow-up Date: 11/05/22 Follow-up type: In-patient    [place] 11/05/2022, 3:04 PM

## 2022-11-05 NOTE — Anesthesia Postprocedure Evaluation (Signed)
Anesthesia Post Note  Patient: Janice Vance  Procedure(s) Performed: AN AD [place]     Patient location during evaluation: Mother Baby Anesthesia Type: Epidural Level of consciousness: awake and alert Pain management: pain level controlled Vital Signs Assessment: post-procedure vital signs reviewed and stable Respiratory status: spontaneous breathing, nonlabored ventilation and respiratory function stable Cardiovascular status: stable Postop Assessment: no headache, no backache and epidural receding Anesthetic complications: no   No notable events documented.  Last Vitals:  Vitals:   11/05/22 0336 11/05/22 0735  BP: 105/63 110/75  Pulse: 69 80  Resp: 18 18  Temp: 36.7 C 36.4 C  SpO2: 99% 97%    Last Pain:  Vitals:   11/05/22 0735  TempSrc: Oral  PainSc: 2    Pain Goal:                   Ailene Ards

## 2022-11-16 ENCOUNTER — Telehealth (HOSPITAL_COMMUNITY): Payer: Self-pay | Admitting: *Deleted

## 2022-11-16 NOTE — Telephone Encounter (Signed)
Left phone voicemail message.  Odis Hollingshead, RN 11-16-2022 at 9:57am

## 2022-11-18 ENCOUNTER — Inpatient Hospital Stay (HOSPITAL_COMMUNITY): Payer: Self-pay

## 2022-12-31 ENCOUNTER — Ambulatory Visit: Payer: Self-pay | Admitting: Family Medicine

## 2022-12-31 ENCOUNTER — Encounter: Payer: Self-pay | Admitting: Allergy and Immunology

## 2022-12-31 ENCOUNTER — Ambulatory Visit (INDEPENDENT_AMBULATORY_CARE_PROVIDER_SITE_OTHER): Payer: BC Managed Care – PPO | Admitting: Allergy and Immunology

## 2022-12-31 VITALS — BP 108/82 | HR 84 | Resp 20 | Ht 68.3 in | Wt 186.0 lb

## 2022-12-31 DIAGNOSIS — L500 Allergic urticaria: Secondary | ICD-10-CM | POA: Diagnosis not present

## 2022-12-31 DIAGNOSIS — J988 Other specified respiratory disorders: Secondary | ICD-10-CM

## 2022-12-31 MED ORDER — AZITHROMYCIN 500 MG PO TABS: 500.0000 mg | ORAL_TABLET | Freq: Every day | ORAL | 0 refills | Status: AC

## 2022-12-31 NOTE — Patient Instructions (Addendum)
  1.  Loratadine 10 mg -1 tablet 1-2 time per day  2.  Azithromycin 500 mg - 1 tablet 1 time per day for 3 days only  3.  Can add benadryl if needed.  4. Further evaluation and treatment???

## 2022-12-31 NOTE — Progress Notes (Unsigned)
[place] [facility] [place] - [place] - [place] -    Follow-up Note  Referring Provider: Pearline Cables, MD Primary Provider: Pearline Cables, MD Date of Office Visit: 12/31/2022  Subjective:   Janice Vance (DOB: [birth year]) is a 32 y.o. female who [facility] in re-evaluation of the following:  HPI: Janice Vance to this clinic in evaluation of hives.  I last saw her in this clinic during her initial evaluation obtained April 2023.  Soon after her last evaluation she resolved all of her urticaria and did well up until approximately 3 weeks ago when she contracted a viral respiratory tract infection from her daughter at daycare that ran throughout the household.  She has lots of cough and she had runny nose.  She actually feels little bit better but still has a lingering cough and it became quite significant this past weekend in conjunction with an explosion of red raised burning lesions across her body without any associated systemic or constitutional symptoms.  She went to the urgent care center received a steroid injection.  She still continues to have problems and she went to the urgent care center this morning and received another steroid injection.  She is breast-feeding and has been doing so since March 2024.  Allergies as of 12/31/2022       Reactions   Codeine Rash   Latex Rash   Penicillins Rash        Medication List    BENADRYL PO Take by mouth as needed.   PRENATAL VITAMIN PO Take by mouth.    Past Medical History:  Diagnosis Date   Anemia    Chicken pox    as a child   Frequent headaches    History of UTI    Hyperlipemia    Newborn product of IVF pregnancy    POTS (postural orthostatic tachycardia syndrome)     Past Surgical History:  Procedure Laterality Date   egg retrieval      Review of systems negative except as noted in HPI / PMHx or noted below:  Review of Systems   Constitutional: Negative.   HENT: Negative.    Eyes: Negative.   Respiratory: Negative.    Cardiovascular: Negative.   Gastrointestinal: Negative.   Genitourinary: Negative.   Musculoskeletal: Negative.   Skin: Negative.   Neurological: Negative.   Endo/Heme/Allergies: Negative.   Psychiatric/Behavioral: Negative.       Objective:   Vitals:   12/31/22 1554  BP: 108/82  Pulse: 84  Resp: 20  SpO2: 97%   Height: 5' 8.3" (173.5 cm)  Weight: 186 lb (84.4 kg)   Physical Exam Constitutional:      Appearance: She is not diaphoretic.  HENT:     Head: Normocephalic.     Right Ear: Tympanic membrane, ear canal and external ear normal.     Left Ear: Tympanic membrane, ear canal and external ear normal.     Nose: Nose normal. No mucosal edema or rhinorrhea.     Mouth/Throat:     Pharynx: Uvula midline. No oropharyngeal exudate.  Eyes:     Conjunctiva/sclera: Conjunctivae normal.  Neck:     Thyroid: No thyromegaly.     Trachea: Trachea normal. No tracheal tenderness or tracheal deviation.  Cardiovascular:     Rate and Rhythm: Normal rate and regular rhythm.     Heart sounds: Normal heart sounds, S1 normal and S2 normal. No murmur heard. Pulmonary:  Effort: No respiratory distress.     Breath sounds: Normal breath sounds. No stridor. No wheezing or rales.  Lymphadenopathy:     Head:     Right side of head: No tonsillar adenopathy.     Left side of head: No tonsillar adenopathy.     Cervical: No cervical adenopathy.  Skin:    Findings: Rash (diffuse blanching urticaria) present. No erythema.     Nails: There is no clubbing.  Neurological:     Mental Status: She is alert.     Diagnostics: none  Assessment and Plan:   1. Allergic urticaria   2. Respiratory tract infection    1.  Loratadine 10 mg -1 tablet 1-2 time per day  2.  Azithromycin 500 mg - 1 tablet 1 time per day for 3 days only  3.  Can add benadryl if needed.  4. Further evaluation and  treatment???  I suspect that Janice Vance has had activation of her immune system secondary to the infectious disease that she has been harboring for the past 3 weeks.  We will cover her for possible mycoplasma infection giving rise to this prolonged cough and immune activation with the use of azithromycin.  She is already had 2 injections of systemic steroids within the past 48 hours and she does not need any more steroids at this point.  She will use loratadine on a consistent basis and she needs to be careful about adding in antihistamines with anticholinergic effects such as Benadryl given the fact that she is breast-feeding as this may decrease her milk production.  She will keep in contact with me noting her response to this approach as she moves forward.  Further evaluation and treatment will be based upon her response.  Janice Schimke, MD Allergy / Immunology [facility]

## 2022-12-31 NOTE — Progress Notes (Deleted)
 Healthcare at [facility] [address], [address] [place], [place] [address] [phone]  Date:  12/31/2022   Name:  Janice Vance   DOB:  May 27, 1991   MRN:  [id number]  PCP:  Pearline Cables, MD    Chief Complaint: No chief complaint on file.   History of Present Illness:  Janice Vance is a 32 y.o. very pleasant female patient who presents with the following:  ***  Patient Active Problem List   Diagnosis Date Noted   Maternal anemia, with delivery 11/05/2022   Encounter for induction of labor 11/04/2022   Second degree perineal laceration 11/04/2022   Postpartum care following vaginal delivery 3/17 11/04/2022   SVD (spontaneous vaginal delivery) 04/18/2021    Past Medical History:  Diagnosis Date   Anemia    Chicken pox    as a child   Frequent headaches    History of UTI    Hyperlipemia    Newborn product of IVF pregnancy    POTS (postural orthostatic tachycardia syndrome)     Past Surgical History:  Procedure Laterality Date   egg retrieval      Social History   Tobacco Use   Smoking status: Never   Smokeless tobacco: Never  Vaping Use   Vaping Use: Never used  Substance Use Topics   Alcohol use: No   Drug use: No    Family History  Problem Relation Age of Onset   Diabetes Father    Breast cancer Maternal Aunt    Heart attack Paternal Uncle    Breast cancer Maternal Grandmother    Supraventricular tachycardia Maternal Grandmother    Heart failure Maternal Grandmother    Asthma Paternal Grandmother    Breast cancer Paternal Grandmother    Supraventricular tachycardia Paternal Grandmother    Heart attack Paternal Grandfather     Allergies  Allergen Reactions   Codeine Rash   Latex Rash   Penicillins Rash    Medication list has been reviewed and updated.  Current Outpatient Medications on File Prior to Visit  Medication Sig Dispense Refill   iron polysaccharides (FERREX 150)  150 MG capsule Take 1 capsule (150 mg total) by mouth daily. 30 capsule 1   Prenatal Vit-Fe Fumarate-FA (PRENATAL VITAMINS) 28-0.8 MG TABS 1 tablet     No current facility-administered medications on file prior to visit.    Review of Systems:  ***  Physical Examination: There were no vitals filed for this visit. There were no vitals filed for this visit. There is no height or weight on file to calculate BMI. Ideal Body Weight:    ***  Assessment and Plan: ***  Signed Abbe Amsterdam, MD

## 2023-01-01 ENCOUNTER — Encounter: Payer: Self-pay | Admitting: Allergy and Immunology

## 2023-01-02 ENCOUNTER — Telehealth: Payer: Self-pay | Admitting: *Deleted

## 2023-01-02 NOTE — Progress Notes (Deleted)
 Healthcare at [facility] [address] [place], [place] [address] [phone]  Date:  01/03/2023   Name:  Janice Vance   DOB:  1991-01-17   MRN:  [id number]  PCP:  Pearline Cables, MD    Chief Complaint: No chief complaint on file.   History of Present Illness:  Janice Vance is a 32 y.o. very pleasant female patient who presents with the following:  Patient seen today with concern of hives-   Patient Active Problem List   Diagnosis Date Noted   Maternal anemia, with delivery 11/05/2022   Encounter for induction of labor 11/04/2022   Second degree perineal laceration 11/04/2022   Postpartum care following vaginal delivery 3/17 11/04/2022   SVD (spontaneous vaginal delivery) 04/18/2021    Past Medical History:  Diagnosis Date   Anemia    Chicken pox    as a child   Frequent headaches    History of UTI    Hyperlipemia    Newborn product of IVF pregnancy    POTS (postural orthostatic tachycardia syndrome)     Past Surgical History:  Procedure Laterality Date   egg retrieval      Social History   Tobacco Use   Smoking status: Never   Smokeless tobacco: Never  Vaping Use   Vaping Use: Never used  Substance Use Topics   Alcohol use: No   Drug use: No    Family History  Problem Relation Age of Onset   Diabetes Father    Breast cancer Maternal Aunt    Heart attack Paternal Uncle    Breast cancer Maternal Grandmother    Supraventricular tachycardia Maternal Grandmother    Heart failure Maternal Grandmother    Asthma Paternal Grandmother    Breast cancer Paternal Grandmother    Supraventricular tachycardia Paternal Grandmother    Heart attack Paternal Grandfather     Allergies  Allergen Reactions   Codeine Rash   Latex Rash   Penicillins Rash    Medication list has been reviewed and updated.  Current Outpatient Medications on File Prior to Visit  Medication Sig Dispense Refill    azithromycin (ZITHROMAX) 500 MG tablet Take 1 tablet (500 mg total) by mouth daily for 3 days. 3 tablet 0   diphenhydrAMINE HCl (BENADRYL PO) Take by mouth as needed.     Prenatal Vit-Fe Fumarate-FA (PRENATAL VITAMIN PO) Take by mouth.     No current facility-administered medications on file prior to visit.    Review of Systems:  ***  Physical Examination: There were no vitals filed for this visit. There were no vitals filed for this visit. There is no height or weight on file to calculate BMI. Ideal Body Weight:    ***  Assessment and Plan: ***  Signed Abbe Amsterdam, MD

## 2023-01-02 NOTE — Telephone Encounter (Signed)
Calls stating that she is breaking out in hives again. It started yesterday. She is taking the loratadine and wants to know what she needs to do.

## 2023-01-02 NOTE — Telephone Encounter (Signed)
Informed of instructions.

## 2023-01-03 ENCOUNTER — Ambulatory Visit: Payer: BC Managed Care – PPO | Admitting: Family Medicine

## 2023-02-11 ENCOUNTER — Ambulatory Visit (HOSPITAL_BASED_OUTPATIENT_CLINIC_OR_DEPARTMENT_OTHER)
Admission: RE | Admit: 2023-02-11 | Discharge: 2023-02-11 | Disposition: A | Discharge status: Home / Self Care | Payer: BC Managed Care – PPO | Source: Ambulatory Visit | Attending: Family Medicine | Admitting: Family Medicine

## 2023-02-11 ENCOUNTER — Ambulatory Visit (INDEPENDENT_AMBULATORY_CARE_PROVIDER_SITE_OTHER): Payer: BC Managed Care – PPO | Admitting: Family Medicine

## 2023-02-11 ENCOUNTER — Encounter: Payer: Self-pay | Admitting: Family Medicine

## 2023-02-11 VITALS — BP 112/60 | HR 73 | Temp 98.2°F | Resp 18 | Ht 68.3 in | Wt 188.4 lb

## 2023-02-11 DIAGNOSIS — E049 Nontoxic goiter, unspecified: Secondary | ICD-10-CM

## 2023-02-11 DIAGNOSIS — E041 Nontoxic single thyroid nodule: Secondary | ICD-10-CM

## 2023-02-11 LAB — COMPREHENSIVE METABOLIC PANEL
ALT: 14 U/L (ref 0–35)
AST: 15 U/L (ref 0–37)
Albumin: 4.6 g/dL (ref 3.5–5.2)
Alkaline Phosphatase: 40 U/L (ref 39–117)
BUN: 11 mg/dL (ref 6–23)
CO2: 27 mEq/L (ref 19–32)
Calcium: 9.8 mg/dL (ref 8.4–10.5)
Chloride: 105 mEq/L (ref 96–112)
Creatinine, Ser: 0.64 mg/dL (ref 0.40–1.20)
GFR: 116.11 mL/min (ref >60.00)
Glucose, Bld: 92 mg/dL (ref 70–99)
Potassium: 3.7 mEq/L (ref 3.5–5.1)
Sodium: 140 mEq/L (ref 135–145)
Total Bilirubin: 0.4 mg/dL (ref 0.2–1.2)
Total Protein: 7.4 g/dL (ref 6.0–8.3)

## 2023-02-11 LAB — T3, FREE: T3, Free: 3 pg/mL (ref 2.3–4.2)

## 2023-02-11 LAB — CBC
HCT: 38.1 % (ref 36.0–46.0)
Hemoglobin: 12.8 g/dL (ref 12.0–15.0)
MCHC: 33.5 g/dL (ref 30.0–36.0)
MCV: 85.6 fl (ref 78.0–100.0)
Platelets: 280 10*3/uL (ref 150.0–400.0)
RBC: 4.46 Mil/uL (ref 3.87–5.11)
RDW: 14.2 % (ref 11.5–15.5)
WBC: 4.5 10*3/uL (ref 4.0–10.5)

## 2023-02-11 LAB — TSH: TSH: 1 u[IU]/mL (ref 0.35–5.50)

## 2023-02-11 NOTE — Patient Instructions (Signed)
Please go to lab- then come back around 5:30- 5:45 tonight to have your ultrasound.  You will check in through the ER (but you don't need to be seen in the ER!) I will be in touch with results asap

## 2023-02-11 NOTE — Progress Notes (Signed)
[facility] at [facility] [address], [address] [place], [place] [address] [phone]  Date:  02/11/2023   Name:  Janice Vance   DOB:  [birth year]   MRN:  [id number]  PCP:  Pearline Cables, MD    Chief Complaint: Thyroid Problem (Pt says she is 3 months postpartum and she has noticed a lump in her throat that continues to grown in size. When she lies down she notices some changes in her swallowing/breathing. )   History of Present Illness:  Janice Vance is a 32 y.o. very pleasant female patient who presents with the following:  She delivered a baby girl 3 months ago, she is doing well.  This is her second child, her older daughter is 68 months old She went back to work just last week She noted a sudden developed of what seems to be thyroid enlargement about a week ago.  It is not painful, she otherwise feels well.  However, she notes that the right side of her thyroid seems to be enlarging almost daily.  The enlargement is visible She notes when she is laying supine with her head turned to the right or on her right side it can be difficult to swallow and sometimes feels like the thyroid is present on her windpipe.  When she is not in this position she is able to swallow and breathe normally  No history of thyroid problems No fever or chills, no tooth issues that she is aware of  Patient Active Problem List   Diagnosis Date Noted   Maternal anemia, with delivery 11/05/2022   Encounter for induction of labor 11/04/2022   Second degree perineal laceration 11/04/2022   Postpartum care following vaginal delivery 3/17 11/04/2022   SVD (spontaneous vaginal delivery) 04/18/2021    Past Medical History:  Diagnosis Date   Anemia    Chicken pox    as a child   Frequent headaches    History of UTI    Hyperlipemia    Newborn product of IVF pregnancy    POTS (postural orthostatic tachycardia syndrome)     Past Surgical  History:  Procedure Laterality Date   egg retrieval      Social History   Tobacco Use   Smoking status: Never   Smokeless tobacco: Never  Vaping Use   Vaping Use: Never used  Substance Use Topics   Alcohol use: No   Drug use: No    Family History  Problem Relation Age of Onset   Diabetes Father    Breast cancer Maternal Aunt    Heart attack Paternal Uncle    Breast cancer Maternal Grandmother    Supraventricular tachycardia Maternal Grandmother    Heart failure Maternal Grandmother    Asthma Paternal Grandmother    Breast cancer Paternal Grandmother    Supraventricular tachycardia Paternal Grandmother    Heart attack Paternal Grandfather     Allergies  Allergen Reactions   Codeine Rash   Latex Rash   Penicillins Rash    Medication list has been reviewed and updated.  Current Outpatient Medications on File Prior to Visit  Medication Sig Dispense Refill   Prenatal Vit-Fe Fumarate-FA (PRENATAL VITAMIN PO) Take by mouth.     No current facility-administered medications on file prior to visit.    Review of Systems:  As per HPI- otherwise negative.   Physical Examination: Vitals:   02/11/23 1356  BP: 112/60  Pulse: 73  Resp:  18  Temp: 98.2 F (36.8 C)  SpO2: 98%   Vitals:   02/11/23 1356  Weight: 188 lb 6.4 oz (85.5 kg)  Height: 5' 8.3" (1.735 m)   Body mass index is 28.4 kg/m. Ideal Body Weight: Weight in (lb) to have BMI = 25: 165.5  GEN: no acute distress.  Normal weight postpartum, looks well HEENT: Atraumatic, Normocephalic.  Bilateral TM wnl, oropharynx normal.  PEERL,EOMI. there is a palpable goiter which is also visible.  Teeth are normal and atraumatic.  No lymphadenopathy is apparent Ears and Nose: No external deformity. CV: RRR, No M/G/R. No JVD. No thrill. No extra heart sounds. PULM: CTA B, no wheezes, crackles, rhonchi. No retractions. No resp. distress. No accessory muscle use. EXTR: No c/c/e PSYCH: Normally interactive.  Conversant.    Assessment and Plan: Thyroid enlargement - Plan: US THYROID, Thyroid peroxidase antibody, TSH, T3, free, CBC, Comprehensive metabolic panel  Patient seen today with concern of apparent thyroid enlargement over the last week or so.  We wonder if she may have postpartum thyroiditis.  Will obtain lab results as above and an ultrasound urgently  Signed Abbe Amsterdam, MD  Received labs as below, message to patient  Results for orders placed or performed in visit on 02/11/23  TSH  Result Value Ref Range   TSH 1.00 0.35 - 5.50 uIU/mL  T3, free  Result Value Ref Range   T3, Free 3.0 2.3 - 4.2 pg/mL  CBC  Result Value Ref Range   WBC 4.5 4.0 - 10.5 K/uL   RBC 4.46 3.87 - 5.11 Mil/uL   Platelets 280.0 150.0 - 400.0 K/uL   Hemoglobin 12.8 12.0 - 15.0 g/dL   HCT 40.9 81.1 - 91.4 %   MCV 85.6 78.0 - 100.0 fl   MCHC 33.5 30.0 - 36.0 g/dL   RDW 78.2 95.6 - 21.3 %  Comprehensive metabolic panel  Result Value Ref Range   Sodium 140 135 - 145 mEq/L   Potassium 3.7 3.5 - 5.1 mEq/L   Chloride 105 96 - 112 mEq/L   CO2 27 19 - 32 mEq/L   Glucose, Bld 92 70 - 99 mg/dL   BUN 11 6 - 23 mg/dL   Creatinine, Ser 0.86 0.40 - 1.20 mg/dL   Total Bilirubin 0.4 0.2 - 1.2 mg/dL   Alkaline Phosphatase 40 39 - 117 U/L   AST 15 0 - 37 U/L   ALT 14 0 - 35 U/L   Total Protein 7.4 6.0 - 8.3 g/dL   Albumin 4.6 3.5 - 5.2 g/dL   GFR 578.46 >96.29 mL/min   Calcium 9.8 8.4 - 10.5 mg/dL

## 2023-02-12 ENCOUNTER — Other Ambulatory Visit: Payer: Self-pay | Admitting: Family Medicine

## 2023-02-12 DIAGNOSIS — E041 Nontoxic single thyroid nodule: Secondary | ICD-10-CM

## 2023-02-12 NOTE — Addendum Note (Signed)
Addended by: Abbe Amsterdam C on: 02/12/2023 02:35 PM   Modules accepted: Orders

## 2023-02-12 NOTE — Progress Notes (Signed)
Patient for US Thyroid Cyst Aspiration only on Wed 02/13/2023, Estell Harpin called and spoke with the patient on the phone and gave pre-procedure instructions. Vickie made the patient aware to be here at 2p and check in at the [facility]. Vickie called the patient on 02/12/2023

## 2023-02-13 ENCOUNTER — Ambulatory Visit
Admission: RE | Admit: 2023-02-13 | Discharge: 2023-02-13 | Disposition: A | Discharge status: Home / Self Care | Payer: BC Managed Care – PPO | Source: Ambulatory Visit | Attending: Family Medicine | Admitting: Family Medicine

## 2023-02-13 ENCOUNTER — Encounter: Payer: Self-pay | Admitting: Family Medicine

## 2023-02-13 DIAGNOSIS — E041 Nontoxic single thyroid nodule: Secondary | ICD-10-CM | POA: Diagnosis not present

## 2023-02-13 LAB — THYROID PEROXIDASE ANTIBODY: Thyroperoxidase Ab SerPl-aCnc: <1 IU/mL (ref <9)

## 2023-02-13 MED ORDER — LIDOCAINE HCL (PF) 1 % IJ SOLN
10.0000 mL | Freq: Once | INTRAMUSCULAR | Status: AC
  Administered 2023-02-13: 10 mL via INTRADERMAL
  Filled 2023-02-13: qty 10

## 2023-02-13 NOTE — Procedures (Addendum)
Successful US guided therapeutic aspiration of 6 cc dark, bloody fluid from right inferior cystic thyroid nodule. No complications. See PACS for full report.    Alex Gardener, AGNP-BC 02/13/2023, 3:25 PM

## 2023-02-28 NOTE — Addendum Note (Signed)
Addended by: Abbe Amsterdam C on: 02/28/2023 01:28 PM   Modules accepted: Orders

## 2023-03-03 ENCOUNTER — Ambulatory Visit (HOSPITAL_BASED_OUTPATIENT_CLINIC_OR_DEPARTMENT_OTHER)
Admission: RE | Admit: 2023-03-03 | Discharge: 2023-03-03 | Disposition: A | Discharge status: Home / Self Care | Payer: BC Managed Care – PPO | Source: Ambulatory Visit | Attending: Family Medicine | Admitting: Family Medicine

## 2023-03-03 DIAGNOSIS — E041 Nontoxic single thyroid nodule: Secondary | ICD-10-CM | POA: Insufficient documentation

## 2023-03-04 ENCOUNTER — Encounter: Payer: Self-pay | Admitting: Family Medicine

## 2023-03-04 DIAGNOSIS — E041 Nontoxic single thyroid nodule: Secondary | ICD-10-CM

## 2023-03-19 ENCOUNTER — Other Ambulatory Visit: Payer: Self-pay | Admitting: General Surgery

## 2023-03-19 DIAGNOSIS — E041 Nontoxic single thyroid nodule: Secondary | ICD-10-CM

## 2023-03-21 ENCOUNTER — Other Ambulatory Visit (HOSPITAL_COMMUNITY)
Admission: RE | Admit: 2023-03-21 | Discharge: 2023-03-21 | Disposition: A | Discharge status: Home / Self Care | Payer: BC Managed Care – PPO | Source: Ambulatory Visit | Attending: Interventional Radiology | Admitting: Interventional Radiology

## 2023-03-21 ENCOUNTER — Ambulatory Visit
Admission: RE | Admit: 2023-03-21 | Discharge: 2023-03-21 | Disposition: A | Discharge status: Home / Self Care | Payer: BC Managed Care – PPO | Source: Ambulatory Visit | Attending: General Surgery | Admitting: General Surgery

## 2023-03-21 DIAGNOSIS — E041 Nontoxic single thyroid nodule: Secondary | ICD-10-CM | POA: Insufficient documentation

## 2023-04-29 ENCOUNTER — Encounter (INDEPENDENT_AMBULATORY_CARE_PROVIDER_SITE_OTHER): Payer: BC Managed Care – PPO | Admitting: Family Medicine

## 2023-04-29 DIAGNOSIS — E041 Nontoxic single thyroid nodule: Secondary | ICD-10-CM | POA: Diagnosis not present

## 2023-04-29 NOTE — Telephone Encounter (Signed)

## 2023-04-29 NOTE — Addendum Note (Signed)
Addended by: Abbe Amsterdam C on: 04/29/2023 04:30 PM   Modules accepted: Orders

## 2023-05-01 ENCOUNTER — Encounter: Payer: Self-pay | Admitting: Family Medicine

## 2023-05-02 ENCOUNTER — Other Ambulatory Visit: Payer: Self-pay | Admitting: Family Medicine

## 2023-05-02 DIAGNOSIS — E041 Nontoxic single thyroid nodule: Secondary | ICD-10-CM

## 2023-05-07 NOTE — Progress Notes (Signed)
Chief Complaint: Patient was seen in virtual consultation today for symptomatic thyroid nodule  Referring Physician(s): Copland,Jessica C  History of Present Illness: Janice Vance is a 32 y.o. female who noted a sudden development of right-sided thyroid enlargement earlier this summer. She was evaluated by her PCP who noted visible thyroid enlargement. The patient also endorsed intermittent difficulty swallowing and breathing while laying supine with her head turned to the right. A thyroid ultrasound was performed which showed a 2.8 cm right inferior cystic nodule. She was referred to Interventional Radiology for aspiration and this was done 02/13/23. 6 ml of dark, serosanguineous fluid was aspirated.   Another thyroid ultrasound was done 03/03/23 and this showed the cyst had returned. She was referred back to IR for aspiration with biopsy. This was performed 03/21/23 with another 6 ml of bloody fluid aspirated. Pathology was negative for malignancy.   The cyst continues to re-accumulate fluid and the patient is interested in pursuing ablation. She presents today via virtual telephone visit for further discussion.    Thyroid Symptom Score: 0-10  Thyroid Cosmetic Score:  {thyroid cosmetic score:27407}  Past Medical History:  Diagnosis Date   Anemia    Chicken pox    as a child   Frequent headaches    History of UTI    Hyperlipemia    Newborn product of IVF pregnancy    POTS (postural orthostatic tachycardia syndrome)     Past Surgical History:  Procedure Laterality Date   egg retrieval      Allergies: Codeine, Latex, and Penicillins  Medications: Prior to Admission medications   Medication Sig Start Date End Date Taking? Authorizing Provider  Prenatal Vit-Fe Fumarate-FA (PRENATAL VITAMIN PO) Take by mouth.    [provider]     Family History  Problem Relation Age of Onset   Diabetes Father    Breast cancer Maternal Aunt    Heart attack Paternal  Uncle    Breast cancer Maternal Grandmother    Supraventricular tachycardia Maternal Grandmother    Heart failure Maternal Grandmother    Asthma Paternal Grandmother    Breast cancer Paternal Grandmother    Supraventricular tachycardia Paternal Grandmother    Heart attack Paternal Grandfather     Social History   Socioeconomic History   Marital status: Married    Spouse name: Not on file   Number of children: Not on file   Years of education: Not on file   Highest education level: Not on file  Occupational History   Not on file  Tobacco Use   Smoking status: Never   Smokeless tobacco: Never  Vaping Use   Vaping status: Never Used  Substance and Sexual Activity   Alcohol use: No   Drug use: No   Sexual activity: Yes  Other Topics Concern   Not on file  Social History Narrative   Not on file   Social Determinants of Health   Financial Resource Strain: Not on file  Food Insecurity: No Food Insecurity (11/04/2022)   Hunger Vital Sign    Worried About Running Out of Food in the Last Year: Never true    Ran Out of Food in the Last Year: Never true  Transportation Needs: No Transportation Needs (11/04/2022)   PRAPARE - Administrator, Civil Service (Medical): No    Lack of Transportation (Non-Medical): No  Physical Activity: Not on file  Stress: Not on file  Social Connections: Unknown (01/02/2022)   Received from [facility]   Social Network    Social Network: Not on file     Review of Systems: A 12 point ROS discussed and pertinent positives are indicated in the HPI above.  All other systems are negative.  Vital Signs: There were no vitals taken for this visit.  No physical exam was performed in lieu of virtual telephone visit.   Imaging:    Labs: CBC 02/11/23 Component Ref Range & Units 2 mo ago (02/11/23) 6 mo ago (11/05/22) 6 mo ago (11/04/22) 1 yr ago (07/19/21) 2 yr ago (04/18/21) 2 yr ago (04/16/21) 2 yr ago (10/04/20)   WBC 4.0 - 10.5 K/uL 4.5 7.3 6.6 4.4 13.1 High  7.8 8.4  RBC 3.87 - 5.11 Mil/uL 4.46 3.52 Low  R 3.72 Low  R 4.30 3.88 R 4.20 R 4.34 R  Platelets 150.0 - 400.0 K/uL 280.0 167 R 210 R 272.0 211 R 211 R 244 R  Hemoglobin 12.0 - 15.0 g/dL 16.1 9.8 Low  09.6 Low  12.8 11.2 Low  12.1 12.9  HCT 36.0 - 46.0 % 38.1 30.0 Low  31.2 Low  37.9 33.7 Low  36.4 37.1  MCV 78.0 - 100.0 fl 85.6 85.2 R 83.9 R 88.1 86.9 R 86.7 R 85.5 R  MCHC 30.0 - 36.0 g/dL 04.5 40.9 81.1 91.4 78.2 33.2 34.8  RDW 11.5 - 15.5 % 14.2 13.7 13.5 13.2 17.3 High  17.2 High  11.9  MCH  27.8 R 28.5 R  28.9 R 28.8 R 29.7 R  nRBC  0.0 R, CM 0.0 R, CM  0.0 R, CM 0.0 R, CM 0.0 R, CM    Coags N/a  TFTs Serum TSH 1.0 Serum free T4 *** Serum T3 3.0 Thyroperoxidase Antibody <1 Thyroglobulin Antibody *** Calcitonin ***   Prior Thyroid FNA: 02/13/23: Aspiration only 03/21/23: Benign   Assessment and Plan:  32 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   Thank you for this interesting consult.  I greatly enjoyed meeting Janice Vance and look forward to participating in their care.  A copy of this report was sent to the requesting provider on this date.  Electronically Signed: Mickie Kay, NP 05/07/2023, 11:00 AM   I spent a total of 40 Minutes    in virtual clinical consultation, greater than 50% of which was counseling/coordinating care for recurrent right thyroid cyst.

## 2023-05-08 ENCOUNTER — Ambulatory Visit
Admission: RE | Admit: 2023-05-08 | Discharge: 2023-05-08 | Disposition: A | Discharge status: Home / Self Care | Payer: BC Managed Care – PPO | Source: Ambulatory Visit | Attending: Family Medicine | Admitting: Family Medicine

## 2023-05-08 DIAGNOSIS — E041 Nontoxic single thyroid nodule: Secondary | ICD-10-CM

## 2023-05-09 ENCOUNTER — Other Ambulatory Visit (HOSPITAL_COMMUNITY): Payer: Self-pay | Admitting: Interventional Radiology

## 2023-05-09 DIAGNOSIS — E041 Nontoxic single thyroid nodule: Secondary | ICD-10-CM

## 2023-06-07 ENCOUNTER — Ambulatory Visit (HOSPITAL_COMMUNITY)
Admission: RE | Admit: 2023-06-07 | Discharge: 2023-06-07 | Disposition: A | Discharge status: Home / Self Care | Payer: BC Managed Care – PPO | Source: Ambulatory Visit | Attending: Interventional Radiology

## 2023-06-07 DIAGNOSIS — E041 Nontoxic single thyroid nodule: Secondary | ICD-10-CM | POA: Diagnosis present

## 2023-06-07 MED ORDER — LIDOCAINE HCL 1 % IJ SOLN
INTRAMUSCULAR | Status: AC
  Filled 2023-06-07: qty 20

## 2023-06-10 ENCOUNTER — Other Ambulatory Visit: Payer: Self-pay | Admitting: Interventional Radiology

## 2023-06-10 DIAGNOSIS — E041 Nontoxic single thyroid nodule: Secondary | ICD-10-CM

## 2023-06-10 LAB — CYTOLOGY - NON PAP

## 2023-07-02 NOTE — Progress Notes (Addendum)
[facility] [address] [place], [place] [address] [phone]  Date:  07/04/2023   Name:  Janice Vance   DOB:  [birth year]   MRN:  [id number]  PCP:  Pearline Cables, MD    Chief Complaint: Annual Exam (Concerns/ questions: none/Pap: sees GYN/Flu shot today: will discuss)   History of Present Illness:  Janice Vance is a 32 y.o. very pleasant female patient who presents with the following:  Patient seen today for physical exam Most recent visit with myself was in June of this year  She had a baby girl in March of this year, she has 2 young daughters- they are 2 and nearly 8 months now  She works with hospice  When I saw her in June she was dealing with painful thyroid enlargement-it turns out she had a large thyroid cyst This cyst was aspirated but unfortunately has continued to come back She met with a surgeon in August-it looks like they planned to observe her cyst due to benign cytology.  However, she contacted me again in mid September with cyst enlargement and had to go another aspiration Fortunately, it seems her most recent aspiration has stuck She is going back to radiology next week for another [place] This area seems normal to her now   Besides this thyroid problems she has generally been in good health Flu vaccine- give today  COVID-19 booster Pap smear- per GYN  Can update labs today.  She did have anemia after delivery but CBC back to normal in June Patient Active Problem List   Diagnosis Date Noted   SVD (spontaneous vaginal delivery) 04/18/2021    Past Medical History:  Diagnosis Date   Anemia    Chicken pox    as a child   Frequent headaches    History of UTI    Hyperlipemia    Newborn product of IVF pregnancy    POTS (postural orthostatic tachycardia syndrome)     Past Surgical History:  Procedure Laterality Date   egg retrieval      Social History   Tobacco Use    Smoking status: Never   Smokeless tobacco: Never  Vaping Use   Vaping status: Never Used  Substance Use Topics   Alcohol use: No   Drug use: No    Family History  Problem Relation Age of Onset   Diabetes Father    Breast cancer Maternal Aunt    Heart attack Paternal Uncle    Breast cancer Maternal Grandmother    Supraventricular tachycardia Maternal Grandmother    Heart failure Maternal Grandmother    Asthma Paternal Grandmother    Breast cancer Paternal Grandmother    Supraventricular tachycardia Paternal Grandmother    Heart attack Paternal Grandfather     Allergies  Allergen Reactions   Codeine Rash   Latex Rash   Penicillins Rash    Medication list has been reviewed and updated.  Current Outpatient Medications on File Prior to Visit  Medication Sig Dispense Refill   Multiple Vitamin (MULTIVITAMIN) capsule Take 1 capsule by mouth daily.     No current facility-administered medications on file prior to visit.    Review of Systems:  As per HPI- otherwise negative.   Physical Examination: Vitals:   07/04/23 1301  BP: 118/60  Pulse: 73  Resp: 18  Temp: 98.1 F (36.7 C)  SpO2: 98%   Vitals:   07/04/23 1301  Weight: 185  lb 6.4 oz (84.1 kg)   Body mass index is 27.94 kg/m. Ideal Body Weight:    GEN: no acute distress. Minimal overweight, looks well  HEENT: Atraumatic, Normocephalic.  Bilateral TM wnl, oropharynx normal.  PEERL,EOMI. at this time her thyroid is mildly full but no large masses like she had before Ears and Nose: No external deformity. CV: RRR, No M/G/R. No JVD. No thrill. No extra heart sounds. PULM: CTA B, no wheezes, crackles, rhonchi. No retractions. No resp. distress. No accessory muscle use. ABD: S, NT, ND, +BS. No rebound. No HSM. EXTR: No c/c/e PSYCH: Normally interactive. Conversant.   Assessment and Plan: Physical exam  Screening for deficiency anemia - Plan: CBC  Screening for diabetes mellitus - Plan: Basic metabolic  panel, Hemoglobin A1c  Screening, lipid - Plan: Lipid panel  Thyroid enlargement - Plan: TSH  Physical exam today.  Encouraged healthy diet and exercise routine Will plan further follow- up pending labs. She has follow-up for her thyroid with ultrasound planned next week Check TSH today Will need to complete insurance form and fax when her labs come in  Signed Abbe Amsterdam, MD  Received labs as below- message to pt and completed her insurance form  Results for orders placed or performed in visit on 07/04/23  CBC  Result Value Ref Range   WBC 5.9 4.0 - 10.5 K/uL   RBC 4.61 3.87 - 5.11 Mil/uL   Platelets 341.0 150.0 - 400.0 K/uL   Hemoglobin 13.3 12.0 - 15.0 g/dL   HCT 16.1 09.6 - 04.5 %   MCV 87.4 78.0 - 100.0 fl   MCHC 32.9 30.0 - 36.0 g/dL   RDW 40.9 81.1 - 91.4 %  Basic metabolic panel  Result Value Ref Range   Sodium 141 135 - 145 mEq/L   Potassium 4.5 3.5 - 5.1 mEq/L   Chloride 104 96 - 112 mEq/L   CO2 27 19 - 32 mEq/L   Glucose, Bld 88 70 - 99 mg/dL   BUN 10 6 - 23 mg/dL   Creatinine, Ser 7.82 0.40 - 1.20 mg/dL   GFR 956.21 >30.86 mL/min   Calcium 9.7 8.4 - 10.5 mg/dL  Hemoglobin V7Q  Result Value Ref Range   Hgb A1c MFr Bld 5.1 4.6 - 6.5 %  Lipid panel  Result Value Ref Range   Cholesterol 186 0 - 200 mg/dL   Triglycerides 469.6 (H) 0.0 - 149.0 mg/dL   HDL 29.52 >84.13 mg/dL   VLDL 24.4 (H) 0.0 - 01.0 mg/dL   LDL Cholesterol 272 (H) 0 - 99 mg/dL   Total CHOL/HDL Ratio 5    NonHDL 145.36   TSH  Result Value Ref Range   TSH 1.06 0.35 - 5.50 uIU/mL

## 2023-07-02 NOTE — Patient Instructions (Incomplete)
It was great to see you again today, I will be in touch with your labs as soon as possible 

## 2023-07-04 ENCOUNTER — Ambulatory Visit (INDEPENDENT_AMBULATORY_CARE_PROVIDER_SITE_OTHER): Payer: BC Managed Care – PPO | Admitting: Family Medicine

## 2023-07-04 VITALS — BP 118/60 | HR 73 | Temp 98.1°F | Resp 18 | Ht 68.3 in | Wt 185.4 lb

## 2023-07-04 DIAGNOSIS — E049 Nontoxic goiter, unspecified: Secondary | ICD-10-CM | POA: Diagnosis not present

## 2023-07-04 DIAGNOSIS — Z23 Encounter for immunization: Secondary | ICD-10-CM | POA: Diagnosis not present

## 2023-07-04 DIAGNOSIS — Z0000 Encounter for general adult medical examination without abnormal findings: Secondary | ICD-10-CM | POA: Diagnosis not present

## 2023-07-04 DIAGNOSIS — Z131 Encounter for screening for diabetes mellitus: Secondary | ICD-10-CM

## 2023-07-04 DIAGNOSIS — Z13220 Encounter for screening for lipoid disorders: Secondary | ICD-10-CM

## 2023-07-04 DIAGNOSIS — Z130 Encounter for screening for diseases of the blood and blood-forming organs and certain disorders involving the immune mechanism: Secondary | ICD-10-CM

## 2023-07-05 ENCOUNTER — Encounter: Payer: Self-pay | Admitting: Family Medicine

## 2023-07-05 LAB — CBC
HCT: 40.3 % (ref 36.0–46.0)
Hemoglobin: 13.3 g/dL (ref 12.0–15.0)
MCHC: 32.9 g/dL (ref 30.0–36.0)
MCV: 87.4 fL (ref 78.0–100.0)
Platelets: 341 10*3/uL (ref 150.0–400.0)
RBC: 4.61 Mil/uL (ref 3.87–5.11)
RDW: 13.5 % (ref 11.5–15.5)
WBC: 5.9 10*3/uL (ref 4.0–10.5)

## 2023-07-05 LAB — TSH: TSH: 1.06 u[IU]/mL (ref 0.35–5.50)

## 2023-07-05 LAB — LIPID PANEL
Cholesterol: 186 mg/dL (ref 0–200)
HDL: 40.3 mg/dL (ref >39.00)
LDL Cholesterol: 103 mg/dL — ABNORMAL HIGH (ref 0–99)
NonHDL: 145.36
Total CHOL/HDL Ratio: 5
Triglycerides: 214 mg/dL — ABNORMAL HIGH (ref 0.0–149.0)
VLDL: 42.8 mg/dL — ABNORMAL HIGH (ref 0.0–40.0)

## 2023-07-05 LAB — BASIC METABOLIC PANEL
BUN: 10 mg/dL (ref 6–23)
CO2: 27 meq/L (ref 19–32)
Calcium: 9.7 mg/dL (ref 8.4–10.5)
Chloride: 104 meq/L (ref 96–112)
Creatinine, Ser: 0.59 mg/dL (ref 0.40–1.20)
GFR: 118.08 mL/min (ref >60.00)
Glucose, Bld: 88 mg/dL (ref 70–99)
Potassium: 4.5 meq/L (ref 3.5–5.1)
Sodium: 141 meq/L (ref 135–145)

## 2023-07-05 LAB — HEMOGLOBIN A1C: Hgb A1c MFr Bld: 5.1 % (ref 4.6–6.5)

## 2023-07-08 ENCOUNTER — Other Ambulatory Visit: Payer: BC Managed Care – PPO

## 2023-07-09 ENCOUNTER — Ambulatory Visit
Admission: RE | Admit: 2023-07-09 | Discharge: 2023-07-09 | Disposition: A | Discharge status: Home / Self Care | Payer: BC Managed Care – PPO | Source: Ambulatory Visit | Attending: Interventional Radiology | Admitting: Interventional Radiology

## 2023-07-09 DIAGNOSIS — E041 Nontoxic single thyroid nodule: Secondary | ICD-10-CM

## 2023-07-09 NOTE — Progress Notes (Signed)
Reason for follow up: The patient is seen in virtual telephone follow up today s/p thyroid cyst aspiration with ethanol ablation 06/07/23  Referring Physician(s): Copland, Jessica  History of present illness: HPI from initial consultation 05/08/23 Janice Vance is a 32 y.o. female who noted a sudden development of right-sided thyroid enlargement earlier this summer. She was evaluated by her PCP who noted visible thyroid enlargement. The patient also endorsed intermittent difficulty swallowing and breathing while laying supine with her head turned to the right. A thyroid ultrasound was performed which showed a 2.8 cm right inferior cystic nodule. She was referred to Interventional Radiology for aspiration and this was done 02/13/23. 6 ml of dark, serosanguineous fluid was aspirated.    Another thyroid ultrasound was done 03/03/23 and this showed the cyst had returned. She was referred back to IR for aspiration with biopsy. This was performed 03/21/23 with another 6 ml of bloody fluid aspirated. Pathology was negative for malignancy.    The cyst continues to re-accumulate fluid and the patient is interested in pursuing ablation. She presents today via virtual telephone visit for further discussion.     She has choking sensation when she lays flat at night.  She has difficulty with swallowing certain foods and fluids.  She endorses some swelling in the right lower neck.  She states that since the last aspiration,  the nodule is now larger than it has been in the past.   Thyroid Cosmetic Score:  Cosmetic problem on swallowing only (Grade 3).   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  On 06/07/23 the cyst was again aspirated with 3 ml of brown fluid removed. Dehydrated ethanol was then administered into the cyst and allowed to dwell for approximately 5 minutes before being aspirated. She tolerated the procedure well and was discharged home with plans for one month follow up/imaging.  She thinks her symptoms have improved significantly.  She has swelling in her neck in the last couple of weeks in the right side, which she attributes to a recent cold/sinus infection.  Past Medical History:  Diagnosis Date   Anemia    Chicken pox    as a child   Frequent headaches    History of UTI    Hyperlipemia    Newborn product of IVF pregnancy    POTS (postural orthostatic tachycardia syndrome)     Past Surgical History:  Procedure Laterality Date   egg retrieval      Allergies: Codeine, Latex, and Penicillins  Medications: Prior to Admission medications   Medication Sig Start Date End Date Taking? Authorizing Provider  Multiple Vitamin (MULTIVITAMIN) capsule Take 1 capsule by mouth daily.    [provider]     Family History  Problem Relation Age of Onset   Diabetes Father    Breast cancer Maternal Aunt    Heart attack Paternal Uncle    Breast cancer Maternal Grandmother    Supraventricular tachycardia Maternal Grandmother    Heart failure Maternal Grandmother    Asthma Paternal Grandmother    Breast cancer Paternal Grandmother    Supraventricular tachycardia Paternal Grandmother    Heart attack Paternal Grandfather     Social History   Socioeconomic History   Marital status: Married    Spouse name: Not on file   Number of children: Not on file   Years of education: Not on file   Highest education level: Not on file  Occupational History   Not on file  Tobacco Use  Smoking status: Never   Smokeless tobacco: Never  Vaping Use   Vaping status: Never Used  Substance and Sexual Activity   Alcohol use: No   Drug use: No   Sexual activity: Yes  Other Topics Concern   Not on file  Social History Narrative   Not on file   Social Determinants of Health   Financial Resource Strain: Not on file  Food Insecurity: No Food Insecurity (11/04/2022)   Hunger Vital Sign    Worried About Running Out of Food in the Last Year: Never true    Ran Out  of Food in the Last Year: Never true  Transportation Needs: No Transportation Needs (11/04/2022)   PRAPARE - Administrator, Civil Service (Medical): No    Lack of Transportation (Non-Medical): No  Physical Activity: Not on file  Stress: Not on file  Social Connections: Unknown (01/02/2022)   [facility] from [facility], [facility]   Social Network    Social Network: Not on file     Vital Signs: There were no vitals taken for this visit.  No physical exam was performed in lieu of virtual telephone visit.   Imaging: US Thyroid 06/07/23  2.4 x 2.6 x 1.5 cm = 4.7 cc  US Thyroid 07/09/23  1.5 x 1.08 x 0.8 = 0.65 cc  Labs:  CBC: Recent Labs    11/04/22 0101 11/05/22 0519 02/11/23 1428 07/04/23 1322  WBC 6.6 7.3 4.5 5.9  HGB 10.6* 9.8* 12.8 13.3  HCT 31.2* 30.0* 38.1 40.3  PLT 210 167 280.0 341.0    COAGS: No results for input(s): "INR", "APTT" in the last 8760 hours.  BMP: Recent Labs    02/11/23 1428 07/04/23 1322  NA 140 141  K 3.7 4.5  CL 105 104  CO2 27 27  GLUCOSE 92 88  BUN 11 10  CALCIUM 9.8 9.7  CREATININE 0.64 0.59    LIVER FUNCTION TESTS: Recent Labs    02/11/23 1428  BILITOT 0.4  AST 15  ALT 14  ALKPHOS 40  PROT 7.4  ALBUMIN 4.6    Assessment and Plan: 32-year-female with a history of recurrent right thyroid cyst which was aspirated and treated with ethanol ablation 06/07/23.  1 month follow up ultrasound demonstrates 86% volume reduction.  She has significantly improved symptoms and is pleased with the results.  Follow up with IR as needed.  Marliss Coots, MD Pager: [phone]    I spent a total of 25 Minutes in virtual telephone clinical consultation, greater than 50% of which was counseling/coordinating care for symptomatic thyroid nodule.

## 2023-07-12 ENCOUNTER — Ambulatory Visit
Admission: RE | Admit: 2023-07-12 | Discharge: 2023-07-12 | Disposition: A | Discharge status: Home / Self Care | Payer: BC Managed Care – PPO | Source: Ambulatory Visit | Attending: Interventional Radiology

## 2023-07-12 DIAGNOSIS — E041 Nontoxic single thyroid nodule: Secondary | ICD-10-CM

## 2023-07-12 HISTORY — PX: IR RADIOLOGIST EVAL & MGMT: IMG5224

## 2023-08-19 ENCOUNTER — Telehealth: Payer: BC Managed Care – PPO | Admitting: Family Medicine

## 2023-09-05 ENCOUNTER — Other Ambulatory Visit: Payer: Self-pay

## 2023-09-06 ENCOUNTER — Other Ambulatory Visit: Payer: Self-pay

## 2023-11-28 ENCOUNTER — Other Ambulatory Visit: Payer: Self-pay | Admitting: Interventional Radiology

## 2023-11-28 DIAGNOSIS — E041 Nontoxic single thyroid nodule: Secondary | ICD-10-CM

## 2023-12-10 ENCOUNTER — Ambulatory Visit
Admission: RE | Admit: 2023-12-10 | Discharge: 2023-12-10 | Disposition: A | Discharge status: Home / Self Care | Source: Ambulatory Visit | Attending: Interventional Radiology | Admitting: Interventional Radiology

## 2023-12-10 DIAGNOSIS — E041 Nontoxic single thyroid nodule: Secondary | ICD-10-CM

## 2023-12-15 ENCOUNTER — Encounter: Payer: Self-pay | Admitting: Family Medicine

## 2023-12-15 NOTE — Progress Notes (Signed)
 This encounter was conducted via the [employer] providing interactive audio and visual communication.  The patient provided verbal consent to conduct a virtual appointment.  The patient was located at their primary residence during this encounter.  Reason for follow up: The patient is seen in virtual video follow up today s/p thyroid  cyst aspiration with ethanol ablation 06/07/23   Referring Physician(s): Copland, Jessica  History of present illness: HPI from last clinic visit  07/12/23 Janice Vance is a 33 y.o. female who noted a sudden development of right-sided thyroid  enlargement earlier this summer. She was evaluated by her PCP who noted visible thyroid  enlargement. The patient also endorsed intermittent difficulty swallowing and breathing while laying supine with her head turned to the right. A thyroid  ultrasound was performed which showed a 2.8 cm right inferior cystic nodule. She was referred to Interventional Radiology for aspiration and this was done 02/13/23. 6 ml of dark, serosanguineous fluid was aspirated.    Another thyroid  ultrasound was done 03/03/23 and this showed the cyst had returned. She was referred back to IR for aspiration with biopsy. This was performed 03/21/23 with another 6 ml of bloody fluid aspirated. Pathology was negative for malignancy.    The cyst continues to re-accumulate fluid and the patient is interested in pursuing ablation. She presents today via virtual telephone visit for further discussion.     She has choking sensation when she lays flat at night.  She has difficulty with swallowing certain foods and fluids.  She endorses some swelling in the right lower neck.  She states that since the last aspiration,  the nodule is now larger than it has been in the past.   Thyroid  Cosmetic Score:  Cosmetic problem on swallowing only (Grade 3).   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   On 06/07/23 the cyst was again  aspirated with 3 ml of brown fluid removed. Dehydrated ethanol was then administered into the cyst and allowed to dwell for approximately 5 minutes before being aspirated. She tolerated the procedure well and was discharged home with plans for one month follow up/imaging. She thinks her symptoms have improved significantly.  She has swelling in her neck in the last couple of weeks in the right side, which she attributes to a recent cold/sinus infection.  1 month follow up ultrasound demonstrated an 86% volume reduction and we discussed a repeat thyroid  ultrasound in 6 months. This was completed 12/10/23 and she presents today via virtual video visit to discuss these results.   Past Medical History:  Diagnosis Date   Anemia    Chicken pox    as a child   Frequent headaches    History of UTI    Hyperlipemia    Newborn product of IVF pregnancy    POTS (postural orthostatic tachycardia syndrome)     Past Surgical History:  Procedure Laterality Date   egg retrieval     IR RADIOLOGIST EVAL & MGMT  07/12/2023    Allergies: Codeine, Latex, and Penicillins  Medications: Prior to Admission medications   Medication Sig Start Date End Date Taking? Authorizing Provider  Multiple Vitamin (MULTIVITAMIN) capsule Take 1 capsule by mouth daily.    [provider]     Family History  Problem Relation Age of Onset   Diabetes Father    Breast cancer Maternal Aunt    Heart attack Paternal Uncle    Breast cancer Maternal Grandmother    Supraventricular tachycardia Maternal  Grandmother    Heart failure Maternal Grandmother    Asthma Paternal Grandmother    Breast cancer Paternal Grandmother    Supraventricular tachycardia Paternal Grandmother    Heart attack Paternal Grandfather     Social History   Socioeconomic History   Marital status: Married    Spouse name: Not on file   Number of children: Not on file   Years of education: Not on file   Highest education level: Not on file   Occupational History   Not on file  Tobacco Use   Smoking status: Never   Smokeless tobacco: Never  Vaping Use   Vaping status: Never Used  Substance and Sexual Activity   Alcohol use: No   Drug use: No   Sexual activity: Yes  Other Topics Concern   Not on file  Social History Narrative   Not on file   Social Drivers of Health   Financial Resource Strain: Not on file  Food Insecurity: No Food Insecurity (11/04/2022)   Hunger Vital Sign    Worried About Running Out of Food in the Last Year: Never true    Ran Out of Food in the Last Year: Never true  Transportation Needs: No Transportation Needs (11/04/2022)   PRAPARE - Administrator, Civil Service (Medical): No    Lack of Transportation (Non-Medical): No  Physical Activity: Not on file  Stress: Not on file  Social Connections: Unknown (01/02/2022)   Received from [facility], [facility]   Social Network    Social Network: Not on file     Vital Signs: There were no vitals taken for this visit.  Physical Exam  Patient is alert, oriented and able to participate fully in the conversation. No apparent discomfort or distress observed. She appears appropriately dressed.    Imaging: US  Thyroid  06/07/23  2.4 x 2.6 x 1.5 cm = 4.7 cc   US  Thyroid  07/09/23  1.5 x 1.08 x 0.8 = 0.65 cc  Labs:  CBC: Recent Labs    02/11/23 1428 07/04/23 1322  WBC 4.5 5.9  HGB 12.8 13.3  HCT 38.1 40.3  PLT 280.0 341.0    COAGS: No results for input(s): "INR", "APTT" in the last 8760 hours.  BMP: Recent Labs    02/11/23 1428 07/04/23 1322  NA 140 141  K 3.7 4.5  CL 105 104  CO2 27 27  GLUCOSE 92 88  BUN 11 10  CALCIUM 9.8 9.7  CREATININE 0.64 0.59    LIVER FUNCTION TESTS: Recent Labs    02/11/23 1428  BILITOT 0.4  AST 15  ALT 14  ALKPHOS 40  PROT 7.4  ALBUMIN 4.6    Assessment and Plan:  33-year-female with a history of recurrent right thyroid  cyst which was aspirated and treated with  ethanol ablation 06/07/23.   Electronically Signed: Fawn Hooks 12/15/2023, 12:26 PM   I spent a total of 25 Minutes in virtual video clinical consultation, greater than 50% of which was counseling/coordinating care for symptomatic thyroid  nodule.

## 2023-12-16 ENCOUNTER — Ambulatory Visit
Admission: RE | Admit: 2023-12-16 | Discharge: 2023-12-16 | Disposition: A | Discharge status: Home / Self Care | Source: Ambulatory Visit | Attending: Interventional Radiology | Admitting: Interventional Radiology

## 2023-12-16 DIAGNOSIS — E041 Nontoxic single thyroid nodule: Secondary | ICD-10-CM

## 2023-12-16 HISTORY — PX: IR RADIOLOGIST EVAL & MGMT: IMG5224

## 2023-12-16 NOTE — Telephone Encounter (Signed)
 It doesn't look like pt has been seen in office for this recently. Do you recommend an OV?

## 2023-12-17 NOTE — Progress Notes (Unsigned)
 [facility] [address] [place], [place] [address] [phone]  Date:  12/18/2023   Name:  Janice Vance   DOB:  05-07-1991   MRN:  [id number]  PCP:  Kaylee Partridge, MD    Chief Complaint: No chief complaint on file.   History of Present Illness:  Janice Vance is a 33 y.o. very pleasant female patient who presents with the following:  Pt seen today with concern of arm and leg pain/ numbness Generally in good health, last seen by myself for her CPE in the fall She recently reached out to me as follows: For the last two weeks my right arm has felt very heavy and my hand feels like it's going to sleep. Trying to bend my hand in hurts. And while using my computer. Also my right leg from the knee down feels heavy and like my foot is asleep. With the needles in it's I've taken Ibuprofen  and used heat and cold at night and the Ibuprofen  has helped some. But I also don't like taking it so often. Not sure if I should try and schedule to see ortho or schedule a mri to see what's wrong?? And I was thinking it was maybe a pinched nerve. I also thought it would get better by now if it wasn't something.    Patient Active Problem List   Diagnosis Date Noted   SVD (spontaneous vaginal delivery) 04/18/2021    Past Medical History:  Diagnosis Date   Anemia    Chicken pox    as a child   Frequent headaches    History of UTI    Hyperlipemia    Newborn product of IVF pregnancy    POTS (postural orthostatic tachycardia syndrome)     Past Surgical History:  Procedure Laterality Date   egg retrieval     IR RADIOLOGIST EVAL & MGMT  07/12/2023   IR RADIOLOGIST EVAL & MGMT  12/16/2023    Social History   Tobacco Use   Smoking status: Never   Smokeless tobacco: Never  Vaping Use   Vaping status: Never Used  Substance Use Topics   Alcohol use: No   Drug use: No    Family History  Problem Relation Age  of Onset   Diabetes Father    Breast cancer Maternal Aunt    Heart attack Paternal Uncle    Breast cancer Maternal Grandmother    Supraventricular tachycardia Maternal Grandmother    Heart failure Maternal Grandmother    Asthma Paternal Grandmother    Breast cancer Paternal Grandmother    Supraventricular tachycardia Paternal Grandmother    Heart attack Paternal Grandfather     Allergies  Allergen Reactions   Codeine Rash   Latex Rash   Penicillins Rash    Medication list has been reviewed and updated.  Current Outpatient Medications on File Prior to Visit  Medication Sig Dispense Refill   Multiple Vitamin (MULTIVITAMIN) capsule Take 1 capsule by mouth daily.     No current facility-administered medications on file prior to visit.    Review of Systems:  As per HPI- otherwise negative.   Physical Examination: There were no vitals filed for this visit. There were no vitals filed for this visit. There is no height or weight on file to calculate BMI. Ideal Body Weight:    GEN: no acute distress. HEENT: Atraumatic, Normocephalic.  Ears and Nose: No external deformity. CV: RRR, No  M/G/R. No JVD. No thrill. No extra heart sounds. PULM: CTA B, no wheezes, crackles, rhonchi. No retractions. No resp. distress. No accessory muscle use. ABD: S, NT, ND, +BS. No rebound. No HSM. EXTR: No c/c/e PSYCH: Normally interactive. Conversant.    Assessment and Plan: ***  Signed Gates Kasal, MD

## 2023-12-18 ENCOUNTER — Encounter: Payer: Self-pay | Admitting: Family Medicine

## 2023-12-18 ENCOUNTER — Ambulatory Visit (HOSPITAL_BASED_OUTPATIENT_CLINIC_OR_DEPARTMENT_OTHER)
Admission: RE | Admit: 2023-12-18 | Discharge: 2023-12-18 | Disposition: A | Discharge status: Home / Self Care | Source: Ambulatory Visit | Attending: Family Medicine | Admitting: Family Medicine

## 2023-12-18 ENCOUNTER — Ambulatory Visit (INDEPENDENT_AMBULATORY_CARE_PROVIDER_SITE_OTHER): Admitting: Family Medicine

## 2023-12-18 VITALS — BP 116/74 | HR 70 | Ht 68.3 in | Wt 181.8 lb

## 2023-12-18 DIAGNOSIS — R202 Paresthesia of skin: Secondary | ICD-10-CM | POA: Insufficient documentation

## 2023-12-18 DIAGNOSIS — M79609 Pain in unspecified limb: Secondary | ICD-10-CM

## 2023-12-18 DIAGNOSIS — E611 Iron deficiency: Secondary | ICD-10-CM

## 2023-12-18 LAB — COMPREHENSIVE METABOLIC PANEL WITH GFR
ALT: 9 U/L (ref 0–35)
AST: 12 U/L (ref 0–37)
Albumin: 4.9 g/dL (ref 3.5–5.2)
Alkaline Phosphatase: 31 U/L — ABNORMAL LOW (ref 39–117)
BUN: 11 mg/dL (ref 6–23)
CO2: 28 meq/L (ref 19–32)
Calcium: 9.4 mg/dL (ref 8.4–10.5)
Chloride: 104 meq/L (ref 96–112)
Creatinine, Ser: 0.57 mg/dL (ref 0.40–1.20)
GFR: 118.68 mL/min (ref >60.00)
Glucose, Bld: 93 mg/dL (ref 70–99)
Potassium: 4.3 meq/L (ref 3.5–5.1)
Sodium: 139 meq/L (ref 135–145)
Total Bilirubin: 0.6 mg/dL (ref 0.2–1.2)
Total Protein: 6.8 g/dL (ref 6.0–8.3)

## 2023-12-18 LAB — CBC
HCT: 37.3 % (ref 36.0–46.0)
Hemoglobin: 12.6 g/dL (ref 12.0–15.0)
MCHC: 33.9 g/dL (ref 30.0–36.0)
MCV: 86 fl (ref 78.0–100.0)
Platelets: 269 10*3/uL (ref 150.0–400.0)
RBC: 4.33 Mil/uL (ref 3.87–5.11)
RDW: 13.3 % (ref 11.5–15.5)
WBC: 3.9 10*3/uL — ABNORMAL LOW (ref 4.0–10.5)

## 2023-12-18 LAB — TSH: TSH: 1.02 u[IU]/mL (ref 0.35–5.50)

## 2023-12-18 LAB — FOLATE: Folate: 15.3 ng/mL (ref >5.9)

## 2023-12-18 LAB — FERRITIN: Ferritin: 5.9 ng/mL — ABNORMAL LOW (ref 10.0–291.0)

## 2023-12-18 LAB — VITAMIN B12: Vitamin B-12: 297 pg/mL (ref 211–911)

## 2023-12-18 NOTE — Patient Instructions (Signed)
 It was good to see you today- I will be in touch with your labs and x-rays asap, and we will set up an MRI of your brain to rule out any sign of MS Please let me know if anything is changing or getting worse in the meantime

## 2023-12-22 ENCOUNTER — Ambulatory Visit (HOSPITAL_BASED_OUTPATIENT_CLINIC_OR_DEPARTMENT_OTHER)
Admission: RE | Admit: 2023-12-22 | Discharge: 2023-12-22 | Disposition: A | Discharge status: Home / Self Care | Source: Ambulatory Visit | Attending: Family Medicine | Admitting: Family Medicine

## 2023-12-22 DIAGNOSIS — R202 Paresthesia of skin: Secondary | ICD-10-CM | POA: Diagnosis present

## 2023-12-22 DIAGNOSIS — M79609 Pain in unspecified limb: Secondary | ICD-10-CM | POA: Insufficient documentation

## 2023-12-22 MED ORDER — GADOBUTROL 1 MMOL/ML IV SOLN
7.5000 mL | Freq: Once | INTRAVENOUS | Status: AC | PRN
  Administered 2023-12-22: 7.5 mL via INTRAVENOUS

## 2023-12-25 ENCOUNTER — Other Ambulatory Visit: Payer: Self-pay | Admitting: *Deleted

## 2023-12-25 ENCOUNTER — Inpatient Hospital Stay

## 2023-12-25 ENCOUNTER — Encounter: Payer: Self-pay | Admitting: Family Medicine

## 2023-12-25 ENCOUNTER — Inpatient Hospital Stay: Attending: Medical Oncology | Admitting: Medical Oncology

## 2023-12-25 ENCOUNTER — Encounter: Payer: Self-pay | Admitting: Medical Oncology

## 2023-12-25 VITALS — BP 115/74 | HR 84 | Temp 97.9°F | Resp 18 | Ht 68.0 in | Wt 181.1 lb

## 2023-12-25 DIAGNOSIS — R202 Paresthesia of skin: Secondary | ICD-10-CM

## 2023-12-25 DIAGNOSIS — D500 Iron deficiency anemia secondary to blood loss (chronic): Secondary | ICD-10-CM

## 2023-12-25 DIAGNOSIS — D509 Iron deficiency anemia, unspecified: Secondary | ICD-10-CM

## 2023-12-25 DIAGNOSIS — E538 Deficiency of other specified B group vitamins: Secondary | ICD-10-CM

## 2023-12-25 DIAGNOSIS — N920 Excessive and frequent menstruation with regular cycle: Secondary | ICD-10-CM | POA: Diagnosis not present

## 2023-12-25 DIAGNOSIS — Z803 Family history of malignant neoplasm of breast: Secondary | ICD-10-CM | POA: Diagnosis not present

## 2023-12-25 HISTORY — DX: Iron deficiency anemia, unspecified: D50.9

## 2023-12-25 MED ORDER — "BD ECLIPSE NEEDLE 23G X 1"" MISC": 1 refills | Status: DC

## 2023-12-25 MED ORDER — CYANOCOBALAMIN 1000 MCG/ML IJ SOLN: INTRAMUSCULAR | 1 refills | Status: DC

## 2023-12-25 MED ORDER — "BD SAFETYGLIDE NEEDLE 25G X 5/8"" MISC": 1.0000 | 1 refills | Status: DC

## 2023-12-25 NOTE — Progress Notes (Signed)
 [facility] [phone]   [phone]  INITIAL CONSULT NOTE  Patient Care Team: Copland, Skipper Dumas, MD as PCP - General (Family Medicine)  CHIEF COMPLAINTS/PURPOSE OF CONSULTATION:  Iron  Deficiency Anemia   HISTORY OF PRESENTING ILLNESS:  Janice Vance 33 y.o. female is referred to our office by their PCP Dr. Geralyn Knee for IDA:   Patient has a fairly longstanding history of iron  deficiency and anemia. She is symptomatic with fatigue, occasional dizziness, occasional numbness and tingling of her extremities- right sided. Symptoms are worse at night.She was referred to our office for monitoring and management.   She has also recently had an MRI of her brain- results pending.   Of note she also has a B12 deficiency.   Blood in stools:No Change of bowel movement/constipation: No Family history of GI cancers: No Last Colonoscopy:N/A Last Endoscopy:N/A History of GERD or GI bleed: No UC/Crohn's: No PPI use: No Hiatal Hernia: No NSAID use: No Blood in urine: No Difficulty swallowing: No Pica: No SOB: Yes with strenuous exercise  Fatigue: Yes Prior blood transfusion: No Prior history of blood loss: No Liver disease: No Kidney Disease: No Bariatric/Intestinal surgery: No Frequent Blood Donation: No Vaginal bleeding: Menstrual cycles are regular in nature. Lasting 6 days. First two days are heavy followed by average flow.  Prior evaluation with hematology: No Prior bone marrow biopsy: No Oral iron : Yes Prior IV iron  infusions: Yes- did well in the past - Venofer   Family history Anemia: No known sickle cell or thalassemia  No Personal or family history of bleeding or clotting disorders.  Special diets: No No use of GLP-1: No Iron  rich foods in diet: Tries to  Eating habits: Normal   MEDICAL HISTORY:  Past Medical History:  Diagnosis Date   Anemia    Chicken pox    as a child   Frequent headaches    History of UTI     Hyperlipemia    Newborn product of IVF pregnancy    POTS (postural orthostatic tachycardia syndrome)     SURGICAL HISTORY: Past Surgical History:  Procedure Laterality Date   egg retrieval     IR RADIOLOGIST EVAL & MGMT  07/12/2023   IR RADIOLOGIST EVAL & MGMT  12/16/2023    SOCIAL HISTORY: Social History   Socioeconomic History   Marital status: Married    Spouse name: Not on file   Number of children: Not on file   Years of education: Not on file   Highest education level: Not on file  Occupational History   Not on file  Tobacco Use   Smoking status: Never   Smokeless tobacco: Never  Vaping Use   Vaping status: Never Used  Substance and Sexual Activity   Alcohol use: No   Drug use: No   Sexual activity: Yes  Other Topics Concern   Not on file  Social History Narrative   Not on file   Social Drivers of Health   Financial Resource Strain: Not on file  Food Insecurity: No Food Insecurity (11/04/2022)   Hunger Vital Sign    Worried About Running Out of Food in the Last Year: Never true    Ran Out of Food in the Last Year: Never true  Transportation Needs: No Transportation Needs (11/04/2022)   PRAPARE - Administrator, Civil Service (Medical): No    Lack of Transportation (Non-Medical): No  Physical Activity: Not on file  Stress: Not on file  Social Connections: Unknown (  01/02/2022)   [facility]   Social Network    Social Network: Not on file  Intimate Partner Violence: Not At Risk (11/04/2022)   Humiliation, Afraid, Rape, and Kick questionnaire    Fear of Current or Ex-Partner: No    Emotionally Abused: No    Physically Abused: No    Sexually Abused: No    FAMILY HISTORY: Family History  Problem Relation Age of Onset   Diabetes Father    Breast cancer Maternal Aunt    Heart attack Paternal Uncle    Breast cancer Maternal Grandmother    Supraventricular tachycardia Maternal Grandmother    Heart failure Maternal  Grandmother    Asthma Paternal Grandmother    Breast cancer Paternal Grandmother    Supraventricular tachycardia Paternal Grandmother    Heart attack Paternal Grandfather     ALLERGIES:  is allergic to codeine, latex, and penicillins.  MEDICATIONS:  Current Outpatient Medications  Medication Sig Dispense Refill   cyanocobalamin (VITAMIN B12) 1000 MCG tablet Take 1,000 mcg by mouth daily.     Ferrous Sulfate  Dried (FERROUS SULFATE  CR PO) Take 2 capsules by mouth daily.     Multiple Vitamin (MULTIVITAMIN) capsule Take 1 capsule by mouth daily.     No current facility-administered medications for this visit.    REVIEW OF SYSTEMS:   Constitutional: ( - ) fevers, ( - )  chills , ( - ) night sweats Eyes: ( - ) blurriness of vision, ( - ) double vision, ( - ) watery eyes Ears, nose, mouth, throat, and face: ( - ) mucositis, ( - ) sore throat Respiratory: ( - ) cough, ( - ) dyspnea, ( - ) wheezes Cardiovascular: ( - ) palpitation, ( - ) chest discomfort, ( - ) lower extremity swelling Gastrointestinal:  ( - ) nausea, ( - ) heartburn, ( - ) change in bowel habits Skin: ( - ) abnormal skin rashes Lymphatics: ( - ) new lymphadenopathy, ( - ) easy bruising Neurological: ( - ) numbness, ( - ) tingling, ( - ) new weaknesses Behavioral/Psych: ( - ) mood change, ( - ) new changes  All other systems were reviewed with the patient and are negative.  PHYSICAL EXAMINATION: ECOG PERFORMANCE STATUS: 1 - Symptomatic but completely ambulatory  Vitals:   12/25/23 1307  BP: 115/74  Pulse: 84  Resp: 18  Temp: 97.9 F (36.6 C)  SpO2: 100%   Filed Weights   12/25/23 1307  Weight: 181 lb 1.3 oz (82.1 kg)    GENERAL: well appearing female in NAD  SKIN: skin color, texture, turgor are normal, no rashes or significant lesions EYES: conjunctiva are pink and non-injected, sclera clear OROPHARYNX: no exudate, no erythema; lips, buccal mucosa, and tongue normal  NECK: supple, non-tender LYMPH:  no  palpable lymphadenopathy in the cervical, axillary or supraclavicular lymph nodes.  LUNGS: clear to auscultation and percussion with normal breathing effort HEART: regular rate & rhythm and no murmurs and no lower extremity edema ABDOMEN: soft, non-tender, non-distended, normal bowel sounds Musculoskeletal: no cyanosis of digits and no clubbing  PSYCH: alert & oriented x 3, fluent speech NEURO: no focal motor/sensory deficits  LABORATORY DATA:  Pending   ASSESSMENT & PLAN Janice Vance is a 33 y.o. caucasian female who was referred to us  for IDA secondary to heavy menses.   Her menstrual cycles have been significantly heavy and long in nature. This appears the most likely cause of her iron  deficiency given the  significant of blood loss. No GI symptoms. I have recommended GI evaluation if iron  deficiency continues or GI symptoms develop. For now we discussed IV iron  as the oral iron  does not appear effective. She has tolerated this well in the past. She is agreeable to start IV therapy. Orders placed and scheduling aware. We will plan for her to follow up in 2 months with labs. She is aware that length of iron  improvement often relates to iron  loss and is very patient dependent. She may need repeat IVF iron  infusions as soon as 2 month or may never need one again. If iron  levels do not respond as expected we will switch to a different version of IV iron . She will hold her oral iron  while undergoing IV iron  treatments and will restart oral iron  following her last infusion. If iron  deficiency continues despite her progression into menopause I would suggest an endoscopy and capsule study.   We will also have her start B12 injections. She states that her sisters and coworkers are nurses trained in injections. She will administer these once weekly x 3 weeks then once per month.   Genetics referral also placed given her family history of breast cancer    All questions were answered. The patient  knows to call the clinic with any problems, questions or concerns.  I have spent a total of 40 minutes minutes of face-to-face and non-face-to-face time, preparing to see the patient, obtaining and/or reviewing separately obtained history, performing a medically appropriate examination, counseling and educating the patient, ordering medications/tests/procedures, referring and communicating with other health care professionals, documenting clinical information in the electronic health record, independently interpreting results and communicating results to the patient, and care coordination.    Sunnie England PA-C Department of Hematology/Oncology [facility]

## 2023-12-30 ENCOUNTER — Encounter: Payer: Self-pay | Admitting: Neurology

## 2024-01-02 ENCOUNTER — Inpatient Hospital Stay

## 2024-01-02 VITALS — BP 101/74 | HR 68 | Temp 97.6°F | Resp 17

## 2024-01-02 DIAGNOSIS — D509 Iron deficiency anemia, unspecified: Secondary | ICD-10-CM | POA: Diagnosis not present

## 2024-01-02 DIAGNOSIS — D500 Iron deficiency anemia secondary to blood loss (chronic): Secondary | ICD-10-CM

## 2024-01-02 MED ORDER — SODIUM CHLORIDE 0.9 % IV SOLN
INTRAVENOUS | Status: DC
Start: 2024-01-02 — End: 2024-01-02

## 2024-01-02 MED ORDER — SODIUM CHLORIDE 0.9 % IV SOLN
300.0000 mg | Freq: Once | INTRAVENOUS | Status: AC
  Administered 2024-01-02: 300 mg via INTRAVENOUS
  Filled 2024-01-02: qty 300

## 2024-01-02 NOTE — Patient Instructions (Signed)

## 2024-01-09 ENCOUNTER — Inpatient Hospital Stay

## 2024-01-09 VITALS — BP 99/66 | HR 66 | Temp 97.9°F | Resp 20

## 2024-01-09 DIAGNOSIS — D509 Iron deficiency anemia, unspecified: Secondary | ICD-10-CM | POA: Diagnosis not present

## 2024-01-09 DIAGNOSIS — D500 Iron deficiency anemia secondary to blood loss (chronic): Secondary | ICD-10-CM

## 2024-01-09 MED ORDER — SODIUM CHLORIDE 0.9 % IV SOLN: INTRAVENOUS | Status: DC

## 2024-01-09 MED ORDER — SODIUM CHLORIDE 0.9 % IV SOLN
300.0000 mg | Freq: Once | INTRAVENOUS | Status: AC
  Administered 2024-01-09: 300 mg via INTRAVENOUS
  Filled 2024-01-09: qty 300

## 2024-01-09 NOTE — Patient Instructions (Signed)

## 2024-01-16 ENCOUNTER — Inpatient Hospital Stay

## 2024-01-16 VITALS — BP 102/67 | HR 65 | Temp 97.7°F | Resp 17

## 2024-01-16 DIAGNOSIS — D500 Iron deficiency anemia secondary to blood loss (chronic): Secondary | ICD-10-CM

## 2024-01-16 DIAGNOSIS — D509 Iron deficiency anemia, unspecified: Secondary | ICD-10-CM | POA: Diagnosis not present

## 2024-01-16 MED ORDER — SODIUM CHLORIDE 0.9 % IV SOLN
300.0000 mg | Freq: Once | INTRAVENOUS | Status: AC
  Administered 2024-01-16: 300 mg via INTRAVENOUS
  Filled 2024-01-16: qty 300

## 2024-01-16 MED ORDER — SODIUM CHLORIDE 0.9 % IV SOLN: INTRAVENOUS | Status: DC

## 2024-01-16 NOTE — Progress Notes (Signed)
 Patient presents for Venofer  infusion complaining of left shoulder discomfort. She describes it as shooting pain with movement and inspiration and denies any swelling. Per chart review, last 2 infusion patient had PIV placed in right arm. Discussed with patient this does not sound related to IV iron  infusions but to continue to monitor. Discussed with Sunnie England, PA-C who agrees it is likely unrelated and recommends patient to follow up with her PCP/urgent care if it does not resolve. Okay to proceed with treatment per PA.

## 2024-01-16 NOTE — Patient Instructions (Signed)

## 2024-01-28 ENCOUNTER — Ambulatory Visit (INDEPENDENT_AMBULATORY_CARE_PROVIDER_SITE_OTHER): Admitting: Neurology

## 2024-01-28 ENCOUNTER — Encounter: Payer: Self-pay | Admitting: Neurology

## 2024-01-28 VITALS — BP 115/76 | HR 80 | Ht 71.0 in | Wt 180.2 lb

## 2024-01-28 DIAGNOSIS — R200 Anesthesia of skin: Secondary | ICD-10-CM | POA: Diagnosis not present

## 2024-01-28 DIAGNOSIS — R202 Paresthesia of skin: Secondary | ICD-10-CM | POA: Diagnosis not present

## 2024-01-28 DIAGNOSIS — R292 Abnormal reflex: Secondary | ICD-10-CM

## 2024-01-28 NOTE — Patient Instructions (Signed)
 We will order MRI cervical spine.  Based on these results, we will decide the next step.

## 2024-01-28 NOTE — Progress Notes (Signed)
 [facility] Clinic Note - Initial Visit   Date: 01/28/2024   Janice Vance MRN: [id number] DOB: 10-25-1990   Dear Dr. Geralyn Knee:  Thank you for your kind referral of Janice Vance for consultation of right side numbness. Although her history is well known to you, please allow us  to reiterate it for the purpose of our medical record. The patient was accompanied to the clinic by self.   Janice Vance is a 33 y.o. left-handed female presenting for evaluation of right side numbness.   IMPRESSION/PLAN: Right hemisensory paresthesias and weakness. MRI brain was personally viewed with patient which does not show any intracranial pathology to explain symptoms.  Specifically, she does not have evidence of multiple sclerosis.  Her reflexes are slightly brisk in the legs, so I will proceed with imaging the cervical spine. If neurological evaluation returns negative, it is possible that her symptoms could be related to low iron  and vitamin B12.  - MRI cervical spine wwo contrast  - Consider nerve testing going forward  Further recommendations pending results.   ------------------------------------------------------------- History of present illness: Starting in April, she developed numbness/heaviness of the right foot and hand which was intermittent at first.  She had difficulty with fine motor tasks, such as using her mouse or opening her children's sippy cup. She continues to have spells tingling of the right lower leg and foot.  She started iron  infusions and vitamin B12 weekly which has helped reduce the intensity of symptoms.  She has not noticed any worsening of symptoms.  MRI brain wwo contrast was essentially normal.   She is started doing a training program for half marathons the month prior.  Nonsmoker. She does not drink alcohol.   She works full-time as Gaffer.    Out-side paper records, electronic medical record, and  images have been reviewed where available and summarized as:  MRI brain wwo contrast 12/23/2023: 1.  No evidence of an acute intracranial abnormality. 2. Two tiny nonspecific chronic insults within the anterior right frontal lobe white matter. 3. Otherwise unremarkable MRI appearance of brain.     Lab Results  Component Value Date   HGBA1C 5.1 07/04/2023   Lab Results  Component Value Date   VITAMINB12 297 12/18/2023   Lab Results  Component Value Date   TSH 1.02 12/18/2023   No results found for: "ESRSEDRATE", "POCTSEDRATE"  Past Medical History:  Diagnosis Date   Anemia    Chicken pox    as a child   Frequent headaches    History of UTI    Hyperlipemia    Newborn product of IVF pregnancy    POTS (postural orthostatic tachycardia syndrome)     Past Surgical History:  Procedure Laterality Date   egg retrieval     IR RADIOLOGIST EVAL & MGMT  07/12/2023   IR RADIOLOGIST EVAL & MGMT  12/16/2023     Medications:  Outpatient Encounter Medications as of 01/28/2024  Medication Sig   cyanocobalamin  (VITAMIN B12) 1000 MCG/ML injection Inject 1 ml (1000 mcg) into the muscle weekly x 4, then monthly.   Multiple Vitamin (MULTIVITAMIN) capsule Take 1 capsule by mouth daily.   NEEDLE, DISP, 23 G (BD ECLIPSE NEEDLE) 23G X 1" MISC Inject 1 ml (1000 mcg) into the muscle every week x 4, then monthly.   NEEDLE, DISP, 25 G (BD SAFETYGLIDE NEEDLE) 25G X 5/8" MISC Inject 1 each into the muscle every 30 (thirty) days.   Ferrous Sulfate  Dried (FERROUS  SULFATE CR PO) Take 2 capsules by mouth daily. (Patient not taking: Reported on 01/28/2024)   No facility-administered encounter medications on file as of 01/28/2024.    Allergies:  Allergies  Allergen Reactions   Codeine Rash   Latex Rash   Penicillins Rash    Family History: Family History  Problem Relation Age of Onset   Diabetes Father    Breast cancer Father    Melanoma Maternal Aunt    Heart attack Paternal Uncle    Breast  cancer Maternal Grandmother    Supraventricular tachycardia Maternal Grandmother    Heart failure Maternal Grandmother    Asthma Paternal Grandmother    Breast cancer Paternal Grandmother    Supraventricular tachycardia Paternal Grandmother    Heart attack Paternal Grandfather     Social History: Social History   Tobacco Use   Smoking status: Never   Smokeless tobacco: Never  Vaping Use   Vaping status: Never Used  Substance Use Topics   Alcohol use: No   Drug use: No   Social History   Social History Narrative   Are you right handed or left handed? Left    Are you currently employed ? 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 no   Who lives with you? Family    What type of home do you live in: 1 story or 2 story? 1 story with bonus room          Vital Signs:  BP 115/76   Pulse 80   Ht 5\' 11"  (1.803 m)   Wt 180 lb 3.2 oz (81.7 kg)   SpO2 99%   BMI 25.13 kg/m     Neurological Exam: MENTAL STATUS including orientation to time, place, person, recent and remote memory, attention span and concentration, language, and fund of knowledge is normal.  Speech is not dysarthric.  CRANIAL NERVES: II:  No visual field defects.     III-IV-VI: Pupils equal round and reactive to light.  Normal conjugate, extra-ocular eye movements in all directions of gaze.  No nystagmus.  No ptosis.   V:  Normal facial sensation.    VII:  Normal facial symmetry and movements.   VIII:  Normal hearing and vestibular function.   IX-X:  Normal palatal movement.   XI:  Normal shoulder shrug and head rotation.   XII:  Normal tongue strength and range of motion, no deviation or fasciculation.  MOTOR:  No atrophy, fasciculations or abnormal movements.  No pronator drift.   Upper Extremity:  Right  Left  Deltoid  5/5   5/5   Biceps  5/5   5/5   Triceps  5/5   5/5   Wrist extensors  5/5   5/5   Wrist flexors  5/5   5/5   Finger extensors  5/5    5/5   Finger flexors  5/5   5/5   Dorsal interossei  5/5   5/5   Abductor pollicis  5/5   5/5   Tone (Ashworth scale)  0  0   Lower Extremity:  Right  Left  Hip flexors  5/5   5/5   Knee extensors  5/5   5/5   Dorsiflexors  5/5   5/5   Plantarflexors  5/5   5/5   Toe extensors  5/5   5/5   Toe flexors  5/5   5/5   Tone (Ashworth scale)  0  0   MSRs:                                           Right        Left brachioradialis 2+  2+  biceps 2+  2+  triceps 2+  2+  patellar 3+  3+  ankle jerk 2+  2+   SENSORY:  Normal and symmetric perception of light touch, pinprick, vibration, and temperature.  Tinel's sign absent at the wrists bilaterally.  COORDINATION/GAIT: Normal finger-to- nose-finger.  Intact rapid alternating movements bilaterally. Gait narrow based and stable. Tandem and stressed gait intact.     Thank you for allowing me to participate in patient's care.  If I can answer any additional questions, I would be pleased to do so.    Sincerely,    Maritza Hosterman K. Janice Sams, DO

## 2024-02-07 ENCOUNTER — Encounter: Payer: Self-pay | Admitting: Neurology

## 2024-02-10 ENCOUNTER — Other Ambulatory Visit

## 2024-02-20 ENCOUNTER — Other Ambulatory Visit: Payer: Self-pay | Admitting: Medical Oncology

## 2024-02-20 DIAGNOSIS — Z862 Personal history of diseases of the blood and blood-forming organs and certain disorders involving the immune mechanism: Secondary | ICD-10-CM

## 2024-02-20 DIAGNOSIS — D500 Iron deficiency anemia secondary to blood loss (chronic): Secondary | ICD-10-CM

## 2024-02-24 ENCOUNTER — Encounter: Payer: Self-pay | Admitting: Neurology

## 2024-02-24 ENCOUNTER — Inpatient Hospital Stay: Admitting: Medical Oncology

## 2024-02-24 ENCOUNTER — Inpatient Hospital Stay: Attending: Hematology & Oncology

## 2024-02-24 DIAGNOSIS — D500 Iron deficiency anemia secondary to blood loss (chronic): Secondary | ICD-10-CM | POA: Insufficient documentation

## 2024-02-24 DIAGNOSIS — E538 Deficiency of other specified B group vitamins: Secondary | ICD-10-CM | POA: Diagnosis not present

## 2024-02-24 DIAGNOSIS — Z862 Personal history of diseases of the blood and blood-forming organs and certain disorders involving the immune mechanism: Secondary | ICD-10-CM

## 2024-02-24 LAB — RETIC PANEL
Immature Retic Fract: 3.9 % (ref 2.3–15.9)
RBC.: 4.24 MIL/uL (ref 3.87–5.11)
Retic Count, Absolute: 113.2 K/uL (ref 19.0–186.0)
Retic Ct Pct: 2.7 % (ref 0.4–3.1)
Reticulocyte Hemoglobin: 34.5 pg (ref >27.9)

## 2024-02-24 LAB — CBC WITH DIFFERENTIAL (CANCER CENTER ONLY)
Abs Immature Granulocytes: 0.05 K/uL (ref 0.00–0.07)
Basophils Absolute: 0 K/uL (ref 0.0–0.1)
Basophils Relative: 1 %
Eosinophils Absolute: 0.1 K/uL (ref 0.0–0.5)
Eosinophils Relative: 1 %
HCT: 36.9 % (ref 36.0–46.0)
Hemoglobin: 12.8 g/dL (ref 12.0–15.0)
Immature Granulocytes: 1 %
Lymphocytes Relative: 36 %
Lymphs Abs: 1.6 K/uL (ref 0.7–4.0)
MCH: 29.9 pg (ref 26.0–34.0)
MCHC: 34.7 g/dL (ref 30.0–36.0)
MCV: 86.2 fL (ref 80.0–100.0)
Monocytes Absolute: 0.5 K/uL (ref 0.1–1.0)
Monocytes Relative: 10 %
Neutro Abs: 2.3 K/uL (ref 1.7–7.7)
Neutrophils Relative %: 51 %
Platelet Count: 256 K/uL (ref 150–400)
RBC: 4.28 MIL/uL (ref 3.87–5.11)
RDW: 12.9 % (ref 11.5–15.5)
WBC Count: 4.5 K/uL (ref 4.0–10.5)
nRBC: 0 % (ref 0.0–0.2)

## 2024-02-24 LAB — VITAMIN B12: Vitamin B-12: 298 pg/mL (ref 180–914)

## 2024-02-24 LAB — FERRITIN: Ferritin: 101 ng/mL (ref 11–307)

## 2024-02-25 ENCOUNTER — Ambulatory Visit: Admitting: Neurology

## 2024-02-25 LAB — IRON AND IRON BINDING CAPACITY (CC-WL,HP ONLY)
Iron: 95 ug/dL (ref 28–170)
Saturation Ratios: 29 % (ref 10.4–31.8)
TIBC: 333 ug/dL (ref 250–450)
UIBC: 238 ug/dL (ref 148–442)

## 2024-02-27 ENCOUNTER — Inpatient Hospital Stay: Admitting: Medical Oncology

## 2024-02-27 DIAGNOSIS — D500 Iron deficiency anemia secondary to blood loss (chronic): Secondary | ICD-10-CM

## 2024-02-27 DIAGNOSIS — E538 Deficiency of other specified B group vitamins: Secondary | ICD-10-CM | POA: Diagnosis not present

## 2024-02-27 MED ORDER — CYANOCOBALAMIN 1000 MCG/ML IJ SOLN: 1000.0000 ug | INTRAMUSCULAR | 2 refills | Status: DC

## 2024-02-27 NOTE — Progress Notes (Signed)
 Virtual Visit Progress Note  Janice Vance,you are scheduled for a virtual visit with your provider today.    Just as we do with appointments in the office, we must obtain your consent to participate.  Your consent will be active for this visit and any virtual visit you may have with one of our providers in the next 365 days.    If you have a MyChart account, I can also send a copy of this consent to you electronically.  All virtual visits are billed to your insurance company just like a traditional visit in the office.  As this is a virtual visit, video technology does not allow for your provider to perform a traditional examination.  This may limit your provider's ability to fully assess your condition.  If your provider identifies any concerns that need to be evaluated in person or the need to arrange testing such as labs, EKG, etc, we will make arrangements to do so.    Although advances in technology are sophisticated, we cannot ensure that it will always work on either your end or our end.  If the connection with a video visit is poor, we may have to switch to a telephone visit.  With either a video or telephone visit, we are not always able to ensure that we have a secure connection.   I need to obtain your verbal consent now.   Are you willing to proceed with your visit today?   Janice Vance has provided verbal consent on 02/27/2024 for a virtual visit (video or telephone).   Janice CHRISTELLA Dais, PA-C 02/27/2024  2:14 PM    I connected with Janice Vance on 02/27/24 at  2:00 PM EDT by video enabled telemedicine visit and verified that I am speaking with the correct person using two identifiers.   I discussed the limitations, risks, security and privacy concerns of performing an evaluation and management service by telemedicine and the availability of in-person appointments. I also discussed with the patient that there may be a patient responsible charge related to this  service. The patient expressed understanding and agreed to proceed.   Other persons participating in the visit and their role in the encounter: None  Patient's location: [place] Provider's location: Clinic   Chief Complaint: Anemia    Patient Care Team: Copland, Harlene BROCKS, MD as PCP - General (Family Medicine) Patel, Donika K, DO as Consulting Physician (Neurology)   Name of the patient: Janice Vance  [id number]  1991-08-03   Date of visit: 02/27/24  Current Therapy:  IV Venofer  B12 IM weekly x 4 then monthly    History of Presenting Illness-  Patient has a fairly longstanding history of iron  deficiency and anemia. She is symptomatic with fatigue, occasional dizziness, occasional numbness and tingling of her extremities- right sided. Symptoms are worse at night.She was referred to our office for monitoring and management.   She also has a history of B12 deficiency  Menstrual cycles are regular in nature lasting about 6 days. Flow is heavy to average in nature.   She has tolerated IV Venofer  well in the past.   Interval History: Janice Vance is a 33 y.o. female who we see for iron  deficiency anemia though to be secondary to heavy menses. She also has a history of B12 deficiency.   Since her initial visit she has had IV Venofer  which she tolerated well. Fatigue is still present. Numbness/tingling have improved some other than chronic right sided pain.  She has been seen by a neurologist and is having some neck pain. She is going to get a MRI w/ wo of her neck. Brain MRI showed two small lesions likely due to her IDA. Neurology is following her.   There has been no bleeding to her knowledge outside of her menstrual cycles: no epistaxis, gingivitis, hemoptysis, hematemesis, hematuria, melena, excessive bruising, blood donation.   Allergies  Allergen Reactions   Codeine Rash   Latex Rash   Penicillins Rash    Past Medical History:  Diagnosis Date   Anemia     Chicken pox    as a child   Frequent headaches    History of UTI    Hyperlipemia    Newborn product of IVF pregnancy    POTS (postural orthostatic tachycardia syndrome)     Past Surgical History:  Procedure Laterality Date   egg retrieval     IR RADIOLOGIST EVAL & MGMT  07/12/2023   IR RADIOLOGIST EVAL & MGMT  12/16/2023    Social History   Socioeconomic History   Marital status: Married    Spouse name: Not on file   Number of children: Not on file   Years of education: Not on file   Highest education level: Not on file  Occupational History   Not on file  Tobacco Use   Smoking status: Never   Smokeless tobacco: Never  Vaping Use   Vaping status: Never Used  Substance and Sexual Activity   Alcohol use: No   Drug use: No   Sexual activity: Yes  Other Topics Concern   Not on file  Social History Narrative   Are you right handed or left handed? Left    Are you currently employed ? 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 no   Who lives with you? Family    What type of home do you live in: 1 story or 2 story? 1 story with bonus room         Social Drivers of Health   Financial Resource Strain: Not on file  Food Insecurity: No Food Insecurity (12/25/2023)   Hunger Vital Sign    Worried About Running Out of Food in the Last Year: Never true    Ran Out of Food in the Last Year: Never true  Transportation Needs: No Transportation Needs (12/25/2023)   PRAPARE - Administrator, Civil Service (Medical): No    Lack of Transportation (Non-Medical): No  Physical Activity: Not on file  Stress: Not on file  Social Connections: Unknown (01/02/2022)   Received from [facility]   Social Network    Social Network: Not on file  Intimate Partner Violence: Not At Risk (12/25/2023)   Humiliation, Afraid, Rape, and Kick questionnaire    Fear of Current or Ex-Partner: No    Emotionally Abused: No    Physically Abused:  No    Sexually Abused: No    Immunization History  Administered Date(s) Administered   Influenza, Seasonal, Injecte, Preservative Fre 07/04/2023   Influenza,inj,Quad PF,6+ Mos 05/10/2016, 07/11/2020, 07/19/2021   PFIZER(Purple Top)SARS-COV-2 Vaccination 04/05/2020, 04/29/2020   Tdap 04/19/2021    Family History  Problem Relation Age of Onset   Diabetes Father    Breast cancer Father    Melanoma Maternal Aunt    Heart attack Paternal Uncle    Breast cancer Maternal Grandmother    Supraventricular tachycardia Maternal Grandmother  Heart failure Maternal Grandmother    Asthma Paternal Grandmother    Breast cancer Paternal Grandmother    Supraventricular tachycardia Paternal Grandmother    Heart attack Paternal Grandfather      Current Outpatient Medications:    cyanocobalamin  (VITAMIN B12) 1000 MCG/ML injection, Inject 1 mL (1,000 mcg total) into the muscle every 14 (fourteen) days. Inject 1 ml (1000 mcg) into the muscle weekly x 4, then monthly., Disp: 8 mL, Rfl: 2   Multiple Vitamin (MULTIVITAMIN) capsule, Take 1 capsule by mouth daily., Disp: , Rfl:    NEEDLE, DISP, 23 G (BD ECLIPSE NEEDLE) 23G X 1 MISC, Inject 1 ml (1000 mcg) into the muscle every week x 4, then monthly., Disp: 10 each, Rfl: 1   NEEDLE, DISP, 25 G (BD SAFETYGLIDE NEEDLE) 25G X 5/8 MISC, Inject 1 each into the muscle every 30 (thirty) days., Disp: 10 each, Rfl: 1  Physical exam: Exam limited due to telemedicine Physical Exam Neurological:     Mental Status: She is alert.     Assessment and plan- Patient is a 33 y.o. female who we see for B12 and iron  deficiency.   CBC and iron  levels have improved with the IV Venofer .  B12 level is still low- she will increase her B12 injections from once monthly to every 14 days. Additional labs to assess for pernicious anemia at next visit Continue follow up with PCP and neurology  RTC 3 months lab only RTC 3 month +- 3-7 days for virtual visit with me    Visit  Diagnosis 1. Iron  deficiency anemia due to chronic blood loss   2. Vitamin B12 deficiency     Patient expressed understanding and was in agreement with this plan. She also understands that She can call clinic at any time with any questions, concerns, or complaints.   I discussed the assessment and treatment plan with the patient. The patient was provided an opportunity to ask questions and all were answered. The patient agreed with the plan and demonstrated an understanding of the instructions.   The patient was advised to call back or seek an in-person evaluation if the symptoms worsen or if the condition fails to improve as anticipated.   I spent 10 minutes face-to-face video visit time dedicated to the care of this patient on the date of this encounter to include pre-visit review of recent labs as well as face-to-face time with the patient, and post visit ordering of testing/documentation.    Thank you for allowing me to participate in the care of this very pleasant patient.   Janice Nellums PA-C

## 2024-02-28 ENCOUNTER — Inpatient Hospital Stay

## 2024-02-28 ENCOUNTER — Encounter: Payer: Self-pay | Admitting: Genetic Counselor

## 2024-02-28 ENCOUNTER — Inpatient Hospital Stay: Admitting: Genetic Counselor

## 2024-02-28 DIAGNOSIS — Z803 Family history of malignant neoplasm of breast: Secondary | ICD-10-CM

## 2024-02-28 DIAGNOSIS — D500 Iron deficiency anemia secondary to blood loss (chronic): Secondary | ICD-10-CM | POA: Diagnosis not present

## 2024-02-28 DIAGNOSIS — Z800 Family history of malignant neoplasm of digestive organs: Secondary | ICD-10-CM

## 2024-02-28 DIAGNOSIS — E538 Deficiency of other specified B group vitamins: Secondary | ICD-10-CM

## 2024-02-28 DIAGNOSIS — Z808 Family history of malignant neoplasm of other organs or systems: Secondary | ICD-10-CM

## 2024-02-28 LAB — IRON AND IRON BINDING CAPACITY (CC-WL,HP ONLY)
Iron: 95 ug/dL (ref 28–170)
Saturation Ratios: 29 % (ref 10.4–31.8)
TIBC: 330 ug/dL (ref 250–450)
UIBC: 235 ug/dL (ref 148–442)

## 2024-02-28 LAB — CBC
HCT: 37.9 % (ref 36.0–46.0)
Hemoglobin: 13.3 g/dL (ref 12.0–15.0)
MCH: 30.4 pg (ref 26.0–34.0)
MCHC: 35.1 g/dL (ref 30.0–36.0)
MCV: 86.5 fL (ref 80.0–100.0)
Platelets: 245 K/uL (ref 150–400)
RBC: 4.38 MIL/uL (ref 3.87–5.11)
RDW: 12.5 % (ref 11.5–15.5)
WBC: 3.7 K/uL — ABNORMAL LOW (ref 4.0–10.5)
nRBC: 0 % (ref 0.0–0.2)

## 2024-02-28 LAB — VITAMIN B12: Vitamin B-12: 349 pg/mL (ref 180–914)

## 2024-02-28 LAB — FERRITIN: Ferritin: 112 ng/mL (ref 11–307)

## 2024-02-28 NOTE — Progress Notes (Signed)
 REFERRING PROVIDER: Tonette Lauraine HERO, PA-C [address] [place],  [place] [address]  PRIMARY PROVIDER:  Watt Harlene BROCKS, MD  PRIMARY REASON FOR VISIT:  1. Family history of breast cancer   2. Family history of malignant neoplasm of gastrointestinal tract   3. Family history of melanoma      HISTORY OF PRESENT ILLNESS:   Ms. Janice Vance, a 33 y.o. female, was seen for a [facility] cancer genetics consultation at the request of Tonette Lauraine HERO, PA-C due to a family history of cancer.  Ms. Robak presents to clinic today to discuss the possibility of a hereditary predisposition to cancer, to discuss genetic testing, and to further clarify her future cancer risks, as well as potential cancer risks for family members.   Ms. Berroa is a 33 y.o. female with no personal history of cancer.    RELEVANT MEDICAL HISTORY:  Menarche was at age 62.  First live birth at age 22.  OCP use for approximately 0 years.  Ovaries intact: yes.  Uterus intact: yes.  Menopausal status: premenopausal.  HRT use: 0 years.  Colonoscopy: no; not examined. Mammogram within the last year: no. Number of breast biopsies: 0. Up to date with pelvic exams: yes. IVF for first pregnancy, female factor infertility   Past Medical History:  Diagnosis Date   Anemia    Chicken pox    as a child   Frequent headaches    History of UTI    Hyperlipemia    Newborn product of IVF pregnancy    POTS (postural orthostatic tachycardia syndrome)     Past Surgical History:  Procedure Laterality Date   egg retrieval     IR RADIOLOGIST EVAL & MGMT  07/12/2023   IR RADIOLOGIST EVAL & MGMT  12/16/2023    Social History   Socioeconomic History   Marital status: Married    Spouse name: Not on file   Number of children: Not on file   Years of education: Not on file   Highest education level: Not on file  Occupational History   Not on file  Tobacco Use   Smoking status: Never   Smokeless tobacco: Never   Vaping Use   Vaping status: Never Used  Substance and Sexual Activity   Alcohol use: No   Drug use: No   Sexual activity: Yes  Other Topics Concern   Not on file  Social History Narrative   Are you right handed or left handed? Left    Are you currently employed ? 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 no   Who lives with you? Family    What type of home do you live in: 1 story or 2 story? 1 story with bonus room         Social Drivers of Health   Financial Resource Strain: Not on file  Food Insecurity: No Food Insecurity (12/25/2023)   Hunger Vital Sign    Worried About Running Out of Food in the Last Year: Never true    Ran Out of Food in the Last Year: Never true  Transportation Needs: No Transportation Needs (12/25/2023)   PRAPARE - Administrator, Civil Service (Medical): No    Lack of Transportation (Non-Medical): No  Physical Activity: Not on file  Stress: Not on file  Social Connections: Unknown (01/02/2022)   Received from [facility]   Social Network  Social Network: Not on file     FAMILY HISTORY:  We obtained a detailed, 4-generation family history.  Significant diagnoses are listed below: Family History  Problem Relation Age of Onset   Diabetes Father    Breast cancer Father    Melanoma Maternal Aunt    Heart attack Paternal Uncle    Breast cancer Maternal Grandmother    Supraventricular tachycardia Maternal Grandmother    Heart failure Maternal Grandmother    Asthma Paternal Grandmother    Breast cancer Paternal Grandmother    Supraventricular tachycardia Paternal Grandmother    Heart attack Paternal Grandfather     Ms. Whitcher is unaware of previous family history of genetic testing for hereditary cancer risks.     GENETIC COUNSELING ASSESSMENT: Ms. Panek is a 33 y.o. female with a family history of cancer which is somewhat suggestive of a hereditary predisposition to cancer  given her father's history of breast cancer diagnosed at age 72, combined with paternal family history of breast cancer and melanoma. We, therefore, discussed and recommended the following at today's visit.   DISCUSSION: We discussed that 5 - 10% of cancer is hereditary, with most cases of hereditary breast cancer associated with pathogenic variants in BRCA1/2.  There are other genes that can be associated with hereditary breast cancer syndromes.  We discussed that testing is beneficial for several reasons, including knowing about other cancer risks, identifying potential screening and risk-reduction options that may be appropriate, and to understanding if other family members could be at risk for cancer and allowing them to undergo genetic testing.  We reviewed the characteristics, features and inheritance patterns of hereditary cancer syndromes. We also discussed genetic testing, including the appropriate family members to test, the process of testing, insurance coverage and turn-around-time for results. We discussed the implications of a negative, positive, carrier and/or variant of uncertain significant result. We discussed that negative results would be uninformative given that Ms. Minotti does not have a personal history of cancer. We recommended Ms. Shimp pursue genetic testing for a panel that contains genes associated with hereditary breast, colorectal and melanoma cancer syndromes.  Ms. Profeta was offered a common hereditary cancer panel (40 genes) and an expanded pan-cancer panel (77 genes). Ms. Eshleman was informed of the benefits and limitations of each panel, including that expanded pan-cancer panels contain several genes that do not have clear management guidelines at this point in time.  We also discussed that as the number of genes included on a panel increases, the chances of variants of uncertain significance increases.  After considering the benefits and limitations of each gene  panel, Ms. Arca elected to have [facility] CancerNext-Expanded +RNAinsight panel.   Based on Ms. Mcelhaney's family history of cancer, she meets medical criteria for genetic testing. Though Ms. Dack is not personally affected, there are no affected family members that are willing/able to undergo hereditary cancer testing. Despite that she meets criteria, she may still have an out of pocket cost. We discussed that if her out of pocket cost for testing is over $100, the laboratory should contact them to discuss self-pay prices, patient pay assistance programs, if applicable, and other billing options.  We discussed that some people do not want to undergo genetic testing due to fear of genetic discrimination.  A federal law called the Genetic Information Non-Discrimination Act (GINA) of 2008 helps protect individuals against genetic discrimination based on their genetic test results.  It impacts both health insurance and employment.  With health insurance, it  protects against increased premiums, being kicked off insurance or being forced to take a test in order to be insured.  For employment it protects against hiring, firing and promoting decisions based on genetic test results.  GINA does not apply to those in the [employer], those who work for companies with less than 15 employees, and new life insurance or long-term disability insurance policies.  Health status due to a cancer diagnosis is not protected under GINA.  Will run breast cancer risk models if genetic testing is uninformative and discuss with return of results.   PLAN: After considering the risks, benefits, and limitations, Ms. Eberwein provided informed consent to pursue genetic testing and the blood sample was sent to [facility] for analysis of the CancerNext-Expanded +RNAinsight test. Results should be available within approximately 2-3 weeks' time, at which point they will be disclosed by telephone to Ms. Viscardi, as will any  additional recommendations warranted by these results. Ms. Perdomo will receive a summary of her genetic counseling visit and a copy of her results once available. This information will also be available in Epic.   Lastly, we encouraged Ms. Trachtenberg to remain in contact with cancer genetics annually so that we can continuously update the family history and inform her of any changes in cancer genetics and testing that may be of benefit for this family.   Ms. Lalli questions were answered to her satisfaction today. Our contact information was provided should additional questions or concerns arise. Thank you for the referral and allowing us  to share in the care of your patient.   Burnard Ogren, MS, [facility] Licensed, Retail [email or web address] phone: [phone]   60 minutes were spent on the date of the encounter in service to the patient including preparation, face-to-face consultation, documentation and care coordination.  The patient was seen alone.  Drs. Gudena and/or Lanny were available to discuss this case as needed.  _______________________________________________________________________ For Office Staff:  Number of people involved in session: 1 Was an Intern/ student involved with case: no

## 2024-03-01 LAB — INTRINSIC FACTOR ANTIBODIES: Intrinsic Factor: 0.9 [AU]/ml (ref 0.0–1.1)

## 2024-03-02 ENCOUNTER — Encounter: Payer: Self-pay | Admitting: *Deleted

## 2024-03-02 ENCOUNTER — Telehealth: Payer: Self-pay | Admitting: *Deleted

## 2024-03-02 LAB — GENETIC SCREENING ORDER

## 2024-03-02 LAB — METHYLMALONIC ACID, SERUM: Methylmalonic Acid, Quantitative: 83 nmol/L (ref 0–378)

## 2024-03-02 NOTE — Telephone Encounter (Signed)
 Opened in error

## 2024-03-03 ENCOUNTER — Ambulatory Visit: Payer: Self-pay | Admitting: Medical Oncology

## 2024-03-03 ENCOUNTER — Ambulatory Visit
Admission: RE | Admit: 2024-03-03 | Discharge: 2024-03-03 | Disposition: A | Discharge status: Home / Self Care | Source: Ambulatory Visit | Attending: Neurology

## 2024-03-03 DIAGNOSIS — R200 Anesthesia of skin: Secondary | ICD-10-CM

## 2024-03-03 DIAGNOSIS — R202 Paresthesia of skin: Secondary | ICD-10-CM

## 2024-03-03 DIAGNOSIS — R292 Abnormal reflex: Secondary | ICD-10-CM

## 2024-03-03 MED ORDER — GADOPICLENOL 0.5 MMOL/ML IV SOLN
8.0000 mL | Freq: Once | INTRAVENOUS | Status: AC | PRN
  Administered 2024-03-03: 8 mL via INTRAVENOUS

## 2024-03-06 ENCOUNTER — Ambulatory Visit: Payer: Self-pay | Admitting: Neurology

## 2024-03-06 DIAGNOSIS — R202 Paresthesia of skin: Secondary | ICD-10-CM

## 2024-03-11 ENCOUNTER — Encounter: Payer: Self-pay | Admitting: Licensed Clinical Social Worker

## 2024-03-11 ENCOUNTER — Ambulatory Visit: Payer: Self-pay | Admitting: Licensed Clinical Social Worker

## 2024-03-11 ENCOUNTER — Telehealth: Payer: Self-pay | Admitting: Licensed Clinical Social Worker

## 2024-03-11 DIAGNOSIS — Z1379 Encounter for other screening for genetic and chromosomal anomalies: Secondary | ICD-10-CM

## 2024-03-11 HISTORY — DX: Encounter for other screening for genetic and chromosomal anomalies: Z13.79

## 2024-03-11 NOTE — Telephone Encounter (Signed)
 I contacted Ms. Byland to discuss her genetic testing results. No pathogenic variants were identified in the 77 genes analyzed. Detailed clinic note to follow.   The test report has been scanned into EPIC and is located under the Molecular Pathology section of the Results Review tab.  A portion of the result report is included below for reference.      Dena Cary, MS, [facility] Genetic Counselor [email or web address] Phone: [phone]

## 2024-03-11 NOTE — Progress Notes (Signed)
 HPI:   Janice Vance was previously seen in the [facility] due to a family history of cancer and concerns regarding a hereditary predisposition to cancer. Please refer to our prior cancer genetics clinic note for more information regarding our discussion, assessment and recommendations, at the time. Janice Vance recent genetic test results were disclosed to her, as were recommendations warranted by these results. These results and recommendations are discussed in more detail below.  CANCER HISTORY:  Oncology History   No history exists.    FAMILY HISTORY:  We obtained a detailed, 4-generation family history.  Significant diagnoses are listed below: Family History  Problem Relation Age of Onset   Diabetes Father    Breast cancer Father 23   Colon cancer Maternal Aunt 35 - 49   Melanoma Paternal Aunt 58       leg, back   Heart attack Paternal Uncle    Breast cancer Maternal Grandmother 81 - 69   Supraventricular tachycardia Maternal Grandmother    Heart failure Maternal Grandmother    Asthma Paternal Grandmother    Breast cancer Paternal Grandmother 39 - 59   Supraventricular tachycardia Paternal Grandmother    Heart attack Paternal Grandfather    Melanoma Maternal Great-grandmother    Thyroid  cancer Paternal Great-grandmother 66 - 60   Ms. Bautch is unaware of previous family history of genetic testing for hereditary cancer risks.       GENETIC TEST RESULTS:  The [facility] CancerNext-Expanded+RNA Panel found no pathogenic mutations.   The CancerNext-Expanded gene panel offered by [facility] and includes sequencing, rearrangement, and RNA analysis for the following 77 genes: AIP, ALK, APC, ATM, AXIN2, BAP1, BARD1, BMPR1A, BRCA1, BRCA2, BRIP1, CDC73, CDH1, CDK4, CDKN1B, CDKN2A, CEBPA, CHEK2, CTNNA1, DDX41, DICER1, ETV6, FH, FLCN, GATA2, LZTR1, MAX, MBD4, MEN1, MET, MLH1, MSH2, MSH3, MSH6, MUTYH, NF1, NF2, NTHL1, PALB2, PHOX2B, PMS2, POT1, PRKAR1A, PTCH1,  PTEN, RAD51C, RAD51D, RB1, RET, RPS20, RUNX1, SDHA, SDHAF2, SDHB, SDHC, SDHD, SMAD4, SMARCA4, SMARCB1, SMARCE1, STK11, SUFU, TMEM127, TP53, TSC1, TSC2, VHL, and WT1 (sequencing and deletion/duplication); EGFR, HOXB13, KIT, MITF, PDGFRA, POLD1, and POLE (sequencing only); EPCAM and GREM1 (deletion/duplication only).   The test report has been scanned into EPIC and is located under the Molecular Pathology section of the Results Review tab.  A portion of the result report is included below for reference. Genetic testing reported out on 03/08/2024.     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  At this time, it is unknown if this variant is associated with an increased risk for cancer or if it is benign, but most uncertain variants are reclassified to benign. It should not be used to make medical management decisions. With time, we suspect the laboratory will determine the significance of this variant, if any. If the laboratory reclassifies this variant, we will attempt to contact Janice Vance to discuss it further.   Even though a pathogenic variant was not identified, possible explanations for the cancer in the family may include: There may be no hereditary risk for cancer in the family. The cancers in Janice Vance and/or her family may be sporadic/familial or due to other genetic and environmental factors. There may be a gene mutation in one of these genes that current testing methods cannot detect but that chance is small. There could be another gene that has not yet been discovered, or that we have not yet tested, that is responsible for the cancer diagnoses in  the family.  It is also possible there is a hereditary cause for the cancer in the family that Janice Vance did not inherit.  Therefore, it is important to remain in touch with cancer genetics in the future so that we can continue to offer Janice Vance the most up to date genetic  testing.   ADDITIONAL GENETIC TESTING:  We discussed with Janice Vance that her genetic testing was fairly extensive.  If there are additional relevant genes identified to increase cancer risk that can be analyzed in the future, we would be happy to discuss and coordinate this testing at that time.    CANCER SCREENING RECOMMENDATIONS:  Janice Vance test result is considered negative (normal).  This means that we have not identified a hereditary cause for her family history of cancer at this time.   An individual's cancer risk and medical management are not determined by genetic test results alone. Overall cancer risk assessment incorporates additional factors, including personal medical history, family history, and any available genetic information that may result in a personalized plan for cancer prevention and surveillance. Therefore, it is recommended she continue to follow the cancer management and screening guidelines provided by her primary healthcare provider.  Based on the reported personal and family history, specific cancer screenings for Janice Vance and her family include:  Breast Cancer Screening:  The Vance model is one of multiple prediction models developed to estimate an individual's lifetime risk of developing breast cancer. The Vance model is endorsed by the [employer] (NCCN). This model includes many risk factors such as family history, endogenous estrogen exposure, and benign breast disease. The calculation is highly-dependent on the accuracy of clinical data provided by the patient and can change over time. The Vance model may be repeated to reflect new information in her personal or family history in the future.    Janice Vance risk score is 26.%.  For women with a greater than 20% lifetime risk of breast cancer, the NCCN recommends the following:    1.   Clinical encounter every 6-12 months to  begin when identified as being at increased risk, but not before age 81    2.   Annual mammograms, tomosynthesis is recommended starting 10 years earlier than the youngest breast cancer diagnosis in the family or at age 71 (whichever comes first), but not before age 75     16.   Annual breast MRI starting 10 years earlier than the youngest breast cancer diagnosis in the family or at age 95 (whichever comes first), but not before age 68       Janice Vance would like for us  to see if Janice Dais PA-C would be willing to follow her as high risk or if we should place referral to [facility] high risk clinic. Message sent to Janice today.   RECOMMENDATIONS FOR FAMILY MEMBERS:   Since she did not inherit a identifiable mutation in a cancer predisposition gene included on this panel, her children could not have inherited a known mutation from her in one of these genes. Individuals in this family might be at some increased risk of developing cancer, over the general population risk, due to the family history of cancer.  Individuals in the family should notify their providers of the family history of cancer. We recommend women in this family have a yearly mammogram beginning at age 71, or 35 years younger than the earliest onset of cancer, an annual clinical breast exam, and  perform monthly breast self-exams.  Family members should have colonoscopies by at age 36, or earlier, as recommended by their providers. Other members of the family may still carry a pathogenic variant in one of these genes that Ms. Stegmaier did not inherit. Based on the family history, we recommend her father have genetic counseling and testing. Ms. Kinnamon will let us  know if we can be of any assistance in coordinating genetic counseling and/or testing for this family member.  She was given my number for the [facility] location since her father lives in [place]. Her sister/maternal relatives could also consider testing.  We  do not recommend familial testing for the POLE variant of uncertain significance (VUS).  FOLLOW-UP:  Lastly, we discussed with Ms. Tarazon that cancer genetics is a rapidly advancing field and it is possible that new genetic tests will be appropriate for her and/or her family members in the future. We encouraged her to remain in contact with cancer genetics on an annual basis so we can update her personal and family histories and let her know of advances in cancer genetics that may benefit this family.   Our contact number was provided. Ms. Kotara questions were answered to her satisfaction, and she knows she is welcome to call us  at anytime with additional questions or concerns.    Dena Cary, MS, [facility] Genetic Counselor [email or web address] Phone: [phone]

## 2024-04-14 ENCOUNTER — Encounter: Payer: Self-pay | Admitting: Licensed Clinical Social Worker

## 2024-04-23 ENCOUNTER — Encounter: Admitting: Neurology

## 2024-04-29 ENCOUNTER — Telehealth: Payer: Self-pay

## 2024-04-29 NOTE — Telephone Encounter (Signed)
 Spoke with patient and confirmed appointment for 9/11

## 2024-04-30 ENCOUNTER — Inpatient Hospital Stay

## 2024-04-30 ENCOUNTER — Inpatient Hospital Stay: Attending: Hematology & Oncology | Admitting: Hematology and Oncology

## 2024-05-25 ENCOUNTER — Telehealth: Payer: Self-pay

## 2024-05-25 NOTE — Telephone Encounter (Signed)
 Spoke with patient and confirmed appointment on 10/7

## 2024-05-26 ENCOUNTER — Inpatient Hospital Stay

## 2024-05-26 ENCOUNTER — Telehealth: Payer: Self-pay | Admitting: Family Medicine

## 2024-05-26 ENCOUNTER — Inpatient Hospital Stay: Attending: Hematology and Oncology | Admitting: Hematology and Oncology

## 2024-05-26 VITALS — BP 113/79 | HR 73 | Temp 98.1°F | Resp 16 | Wt 181.4 lb

## 2024-05-26 DIAGNOSIS — Z803 Family history of malignant neoplasm of breast: Secondary | ICD-10-CM | POA: Diagnosis not present

## 2024-05-26 DIAGNOSIS — Z9189 Other specified personal risk factors, not elsewhere classified: Secondary | ICD-10-CM

## 2024-05-26 DIAGNOSIS — Z808 Family history of malignant neoplasm of other organs or systems: Secondary | ICD-10-CM | POA: Diagnosis not present

## 2024-05-26 DIAGNOSIS — D509 Iron deficiency anemia, unspecified: Secondary | ICD-10-CM | POA: Insufficient documentation

## 2024-05-26 DIAGNOSIS — E538 Deficiency of other specified B group vitamins: Secondary | ICD-10-CM | POA: Insufficient documentation

## 2024-05-26 DIAGNOSIS — Z800 Family history of malignant neoplasm of digestive organs: Secondary | ICD-10-CM | POA: Insufficient documentation

## 2024-05-26 LAB — CBC WITH DIFFERENTIAL/PLATELET
Abs Immature Granulocytes: 0 K/uL (ref 0.00–0.07)
Basophils Absolute: 0 K/uL (ref 0.0–0.1)
Basophils Relative: 1 %
Eosinophils Absolute: 0.1 K/uL (ref 0.0–0.5)
Eosinophils Relative: 2 %
HCT: 36.2 % (ref 36.0–46.0)
Hemoglobin: 12.5 g/dL (ref 12.0–15.0)
Immature Granulocytes: 0 %
Lymphocytes Relative: 39 %
Lymphs Abs: 1.7 K/uL (ref 0.7–4.0)
MCH: 30.1 pg (ref 26.0–34.0)
MCHC: 34.5 g/dL (ref 30.0–36.0)
MCV: 87.2 fL (ref 80.0–100.0)
Monocytes Absolute: 0.4 K/uL (ref 0.1–1.0)
Monocytes Relative: 9 %
Neutro Abs: 2.2 K/uL (ref 1.7–7.7)
Neutrophils Relative %: 49 %
Platelets: 290 K/uL (ref 150–400)
RBC: 4.15 MIL/uL (ref 3.87–5.11)
RDW: 11.9 % (ref 11.5–15.5)
WBC: 4.4 K/uL (ref 4.0–10.5)
nRBC: 0 % (ref 0.0–0.2)

## 2024-05-26 LAB — IRON AND IRON BINDING CAPACITY (CC-WL,HP ONLY)
Iron: 67 ug/dL (ref 28–170)
Saturation Ratios: 20 % (ref 10.4–31.8)
TIBC: 333 ug/dL (ref 250–450)
UIBC: 266 ug/dL (ref 148–442)

## 2024-05-26 LAB — FERRITIN: Ferritin: 82 ng/mL (ref 11–307)

## 2024-05-26 LAB — VITAMIN B12: Vitamin B-12: 646 pg/mL (ref 180–914)

## 2024-05-26 NOTE — Progress Notes (Unsigned)
 [facility] CONSULT NOTE  Patient Care Team: Copland, Harlene BROCKS, MD as PCP - General (Family Medicine) Patel, Donika K, DO as Consulting Physician (Neurology)  CHIEF COMPLAINTS/PURPOSE OF CONSULTATION:  Family history of breast cancer  ASSESSMENT & PLAN:   Assessment and Plan Assessment & Plan High risk for breast cancer Increased lifetime risk estimated at 26.6% per TC model. 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 Intensive screening recommended. Discussed lifestyle modifications and potential future use of tamoxifen. - Order mammogram in the next few weeks. Youngest family member with breast cancer is her dad at the age of 68. - Order MRI in April. - Alternate mammogram and MRI every six months. - Provide lifestyle counseling on diet, exercise, and alcohol avoidance. - SBE recommended every month.  Iron  deficiency anemia Iron  deficiency anemia possibly related to pregnancy. Last infusion over six months ago. Ferritin level improved to 112 in July. - Order blood work to check current iron  levels. - Consider oral iron  supplementation if levels are not significantly low. - Monitor iron  levels every six months if current labs are well-managed.  Vitamin B12 deficiency Low B12 levels managed with injections. Discussed possibility of oral supplementation. - Order blood work to check current B12 levels. - Consider oral B12 supplementation if levels are not significantly low.   HISTORY OF PRESENTING ILLNESS:  Janice Vance 33 y.o. female is here because of high risk for [facility]  Discussed the use of AI scribe software for clinical note transcription with the patient, who gave verbal consent to proceed.  History of Present Illness Janice Vance is a 33 year old female who presents for evaluation of her breast cancer risk and genetic testing results. She was referred by her primary doctor for evaluation of her breast cancer risk due to family history.  She  has a significant family history of breast cancer, with her father diagnosed at 87 and now cured, her paternal grandmother diagnosed in her fifties and deceased at 42, and her maternal grandmother diagnosed in her seventies and still alive. 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  She has not started mammograms or MRIs and has not had any breast biopsies. No history of breast cancer symptoms. She has two daughters, aged three years and eighteen months, and breastfed her second daughter but not her first.  She has a history of iron  deficiency anemia, initially identified during her first pregnancy, and has received iron  infusions, the last being over six months ago. [place] She reports a history of numbness on her right side, which led to a workup including MRIs of her brain and spine. Her B12 levels have been low, and she has been receiving B12 injections.  She is 5'10 and weighs 181 pounds. She does not consume alcohol and maintains a healthy lifestyle with a plant-based diet and regular exercise. She works in Patent examiner for hospice care, and her husband is an Art gallery manager.  All other systems were reviewed with the patient and are negative.  MEDICAL HISTORY:  Past Medical History:  Diagnosis Date   Anemia    Chicken pox    as a child   Frequent headaches    History of UTI    Hyperlipemia    Newborn product of IVF pregnancy    POTS (postural orthostatic tachycardia syndrome)     SURGICAL HISTORY: Past Surgical History:  Procedure Laterality Date   egg retrieval     IR RADIOLOGIST EVAL & MGMT  07/12/2023   IR RADIOLOGIST EVAL & MGMT  12/16/2023    SOCIAL HISTORY: Social History   Socioeconomic History   Marital status: Married    Spouse name: Not on file   Number of children: Not on file   Years of education: Not on file   Highest education  level: Not on file  Occupational History   Not on file  Tobacco Use   Smoking status: Never   Smokeless tobacco: Never  Vaping Use   Vaping status: Never Used  Substance and Sexual Activity   Alcohol use: No   Drug use: No   Sexual activity: Yes  Other Topics Concern   Not on file  Social History Narrative   Are you right handed or left handed? Left    Are you currently employed ? 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 no   Who lives with you? Family    What type of home do you live in: 1 story or 2 story? 1 story with bonus room         Social Drivers of Health   Financial Resource Strain: Not on file  Food Insecurity: No Food Insecurity (12/25/2023)   Hunger Vital Sign    Worried About Running Out of Food in the Last Year: Never true    Ran Out of Food in the Last Year: Never true  Transportation Needs: No Transportation Needs (12/25/2023)   PRAPARE - Administrator, Civil Service (Medical): No    Lack of Transportation (Non-Medical): No  Physical Activity: Not on file  Stress: Not on file  Social Connections: Unknown (01/02/2022)   Received from [facility]   Social Network    Social Network: Not on file  Intimate Partner Violence: Not At Risk (12/25/2023)   Humiliation, Afraid, Rape, and Kick questionnaire    Fear of Current or Ex-Partner: No    Emotionally Abused: No    Physically Abused: No    Sexually Abused: No    FAMILY HISTORY: Family History  Problem Relation Age of Onset   Diabetes Father    Breast cancer Father 75   Colon cancer Maternal Aunt 67 - 49   Melanoma Paternal Aunt 58       leg, back   Heart attack Paternal Uncle    Breast cancer Maternal Grandmother 60 - 69   Supraventricular tachycardia Maternal Grandmother    Heart failure Maternal Grandmother    Asthma Paternal Grandmother    Breast cancer Paternal Grandmother 35 - 59   Supraventricular tachycardia Paternal Grandmother     Heart attack Paternal Grandfather    Melanoma Maternal Great-grandmother    Thyroid  cancer Paternal Great-grandmother 32 - 39    ALLERGIES:  is allergic to codeine, latex, and penicillins.  MEDICATIONS:  Current Outpatient Medications  Medication Sig Dispense Refill   cyanocobalamin  (VITAMIN B12) 1000 MCG/ML injection Inject 1 mL (1,000 mcg total) into the muscle every 14 (fourteen) days. Inject 1 ml (1000 mcg) into the muscle weekly x 4, then monthly. 8 mL 2   Multiple Vitamin (MULTIVITAMIN) capsule Take 1 capsule by mouth daily.     NEEDLE, DISP, 23 G (BD ECLIPSE NEEDLE) 23G X 1 MISC Inject 1 ml (1000 mcg) into the muscle every week x 4, then monthly. 10 each 1  NEEDLE, DISP, 25 G (BD SAFETYGLIDE NEEDLE) 25G X 5/8 MISC Inject 1 each into the muscle every 30 (thirty) days. 10 each 1   No current facility-administered medications for this visit.     PHYSICAL EXAMINATION: ECOG PERFORMANCE STATUS: 0 - Asymptomatic  Vitals:   05/26/24 1322  BP: 113/79  Pulse: 73  Resp: 16  Temp: 98.1 F (36.7 C)  SpO2: 98%   Filed Weights   05/26/24 1322  Weight: 181 lb 6.4 oz (82.3 kg)    GENERAL:alert, no distress and comfortable Bilateral breasts inspected. No palpable masses. No regional adenopathy CTA bilaterally RRR NEURO: no focal motor/sensory deficits  LABORATORY DATA:  I have reviewed the data as listed Lab Results  Component Value Date   WBC 3.7 (L) 02/28/2024   HGB 13.3 02/28/2024   HCT 37.9 02/28/2024   MCV 86.5 02/28/2024   PLT 245 02/28/2024     Chemistry      Component Value Date/Time   NA 139 12/18/2023 1041   K 4.3 12/18/2023 1041   CL 104 12/18/2023 1041   CO2 28 12/18/2023 1041   BUN 11 12/18/2023 1041   CREATININE 0.57 12/18/2023 1041      Component Value Date/Time   CALCIUM 9.4 12/18/2023 1041   ALKPHOS 31 (L) 12/18/2023 1041   AST 12 12/18/2023 1041   ALT 9 12/18/2023 1041   BILITOT 0.6 12/18/2023 1041       RADIOGRAPHIC STUDIES: I  have personally reviewed the radiological images as listed and agreed with the findings in the report. No results found.  All questions were answered. The patient knows to call the clinic with any problems, questions or concerns. I spent 45 minutes in the care of this patient including H and P, review of records, counseling and coordination of care.     Amber Stalls, MD 05/26/2024 1:37 PM

## 2024-05-26 NOTE — Telephone Encounter (Signed)
 Called pt and left a VM advising pt to give the office a call back to go ahead and get an appointment scheduled.

## 2024-05-26 NOTE — Telephone Encounter (Signed)
 Copied from CRM [id number]. Topic: Appointments - Scheduling Inquiry for Clinic >> May 26, 2024 10:34 AM [place] A wrote: Reason for CRM: Patient has appt scheduled for 07/06/24 however with insurance she needs Physical to be completed before 06/29/24. Earliest appt is 07/06/24 patient said that Dr.Copland has completed forms before for her and wants her to complete again with out Physical.

## 2024-05-27 ENCOUNTER — Ambulatory Visit: Payer: Self-pay | Admitting: Hematology and Oncology

## 2024-05-27 ENCOUNTER — Encounter: Payer: Self-pay | Admitting: Hematology & Oncology

## 2024-05-27 NOTE — Telephone Encounter (Signed)
 Attempted to call pt. LVM for call back

## 2024-05-27 NOTE — Telephone Encounter (Signed)
-----   Message from Bolivar Iruku sent at 05/27/2024  7:23 AM EDT ----- Iron  panel and B12 are good, no need to do IV iron  or B12 inj. She can take oral B12 1000 mcg daily and ferrous sulfate  65 mg couple times a week until her next appointment. ----- Message ----- From: Rebecka, Lab In [place] Sent: 05/26/2024   2:30 PM EDT To: Amber Stalls, MD

## 2024-05-28 ENCOUNTER — Inpatient Hospital Stay

## 2024-06-02 ENCOUNTER — Inpatient Hospital Stay: Admitting: Medical Oncology

## 2024-06-02 NOTE — Progress Notes (Deleted)
 Virtual Visit Progress Note  [name],you are scheduled for a virtual visit with your provider today.    Just as we do with appointments in the office, we must obtain your consent to participate.  Your consent will be active for this visit and any virtual visit you may have with one of our providers in the next 365 days.    If you have a MyChart account, I can also send a copy of this consent to you electronically.  All virtual visits are billed to your insurance company just like a traditional visit in the office.  As this is a virtual visit, video technology does not allow for your provider to perform a traditional examination.  This may limit your provider's ability to fully assess your condition.  If your provider identifies any concerns that need to be evaluated in person or the need to arrange testing such as labs, EKG, etc, we will make arrangements to do so.    Although advances in technology are sophisticated, we cannot ensure that it will always work on either your end or our end.  If the connection with a video visit is poor, we may have to switch to a telephone visit.  With either a video or telephone visit, we are not always able to ensure that we have a secure connection.   I need to obtain your verbal consent now.   Are you willing to proceed with your visit today?   [name] has provided verbal consent on 06/02/2024 for a virtual visit (video or telephone).   Lauraine CHRISTELLA Dais, PA-C 06/02/2024  2:38 PM    I connected with [name] on 06/02/24 at  2:30 PM EDT by video enabled telemedicine visit and verified that I am speaking with the correct person using two identifiers.   I discussed the limitations, risks, security and privacy concerns of performing an evaluation and management service by telemedicine and the availability of in-person appointments. I also discussed with the patient that there may be a patient responsible charge related to this  service. The patient expressed understanding and agreed to proceed.   Other persons participating in the visit and their role in the encounter: None  Patient's location: [place] Provider's location: Clinic   Chief Complaint: Anemia    Patient Care Team: Copland, Harlene BROCKS, MD as PCP - General (Family Medicine) Patel, Donika K, DO as Consulting Physician (Neurology)   Name of the patient: Janice Vance  [id number]  [birth year]   Date of visit: 06/02/24  Current Therapy:  IV Venofer  B12 IM every other week   History of Presenting Illness-  Patient has a fairly longstanding history of iron  deficiency and anemia. She is symptomatic with fatigue, occasional dizziness, occasional numbness and tingling of her extremities- right sided. Symptoms are worse at night.She was referred to our office for monitoring and management. Of note, she is also followed by Dr. Loretha  at the [facility] for being High Risk for Breast Cancer. Her last visit with Dr. Loretha was on 05/26/2024.   She also has a history of B12 deficiency  She has tolerated IV Venofer  well in the past.   Interval History: Janice Vance is a 33 y.o. female who we see for iron  deficiency anemia though to be secondary to heavy menses. She also has a history of B12 deficiency.   Today she reports ***.   In the past she has been treated with IV Venofer  which she  tolerated well.  Menstrual cycles are regular in nature lasting about 6 days. Flow is heavy to average in nature.   There has been no bleeding to her knowledge outside of her menstrual cycles: no epistaxis, gingivitis, hemoptysis, hematemesis, hematuria, melena, excessive bruising, blood donation.   Allergies  Allergen Reactions   Codeine Rash   Latex Rash   Penicillins Rash    Past Medical History:  Diagnosis Date   Anemia    Chicken pox    as a child   Frequent headaches    History of UTI    Hyperlipemia    Newborn product of IVF pregnancy     POTS (postural orthostatic tachycardia syndrome)     Past Surgical History:  Procedure Laterality Date   egg retrieval     IR RADIOLOGIST EVAL & MGMT  07/12/2023   IR RADIOLOGIST EVAL & MGMT  12/16/2023    Social History   Socioeconomic History   Marital status: Married    Spouse name: Not on file   Number of children: Not on file   Years of education: Not on file   Highest education level: Not on file  Occupational History   Not on file  Tobacco Use   Smoking status: Never   Smokeless tobacco: Never  Vaping Use   Vaping status: Never Used  Substance and Sexual Activity   Alcohol use: No   Drug use: No   Sexual activity: Yes  Other Topics Concern   Not on file  Social History Narrative   Are you right handed or left handed? Left    Are you currently employed ? 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 no   Who lives with you? Family    What type of home do you live in: 1 story or 2 story? 1 story with bonus room         Social Drivers of Health   Financial Resource Strain: Not on file  Food Insecurity: No Food Insecurity (12/25/2023)   Hunger Vital Sign    Worried About Running Out of Food in the Last Year: Never true    Ran Out of Food in the Last Year: Never true  Transportation Needs: No Transportation Needs (12/25/2023)   PRAPARE - Administrator, Civil Service (Medical): No    Lack of Transportation (Non-Medical): No  Physical Activity: Not on file  Stress: Not on file  Social Connections: Unknown (01/02/2022)   Received from [facility]   Social Network    Social Network: Not on file  Intimate Partner Violence: Not At Risk (12/25/2023)   Humiliation, Afraid, Rape, and Kick questionnaire    Fear of Current or Ex-Partner: No    Emotionally Abused: No    Physically Abused: No    Sexually Abused: No    Immunization History  Administered Date(s) Administered   Influenza, Seasonal, Injecte,  Preservative Fre 07/04/2023   Influenza,inj,Quad PF,6+ Mos 05/10/2016, 07/11/2020, 07/19/2021   PFIZER(Purple Top)SARS-COV-2 Vaccination 04/05/2020, 04/29/2020   Tdap 04/19/2021    Family History  Problem Relation Age of Onset   Diabetes Father    Breast cancer Father 41   Colon cancer Maternal Aunt 40 - 49   Melanoma Paternal Aunt 58       leg, back   Heart attack Paternal Uncle    Breast cancer Maternal Grandmother 60 - 69   Supraventricular tachycardia Maternal Grandmother  Heart failure Maternal Grandmother    Asthma Paternal Grandmother    Breast cancer Paternal Grandmother 69 - 59   Supraventricular tachycardia Paternal Grandmother    Heart attack Paternal Grandfather    Melanoma Maternal Great-grandmother    Thyroid  cancer Paternal Great-grandmother 67 - 79     Current Outpatient Medications:    cyanocobalamin  (VITAMIN B12) 1000 MCG/ML injection, Inject 1 mL (1,000 mcg total) into the muscle every 14 (fourteen) days. Inject 1 ml (1000 mcg) into the muscle weekly x 4, then monthly., Disp: 8 mL, Rfl: 2   Multiple Vitamin (MULTIVITAMIN) capsule, Take 1 capsule by mouth daily., Disp: , Rfl:    NEEDLE, DISP, 23 G (BD ECLIPSE NEEDLE) 23G X 1 MISC, Inject 1 ml (1000 mcg) into the muscle every week x 4, then monthly., Disp: 10 each, Rfl: 1   NEEDLE, DISP, 25 G (BD SAFETYGLIDE NEEDLE) 25G X 5/8 MISC, Inject 1 each into the muscle every 30 (thirty) days., Disp: 10 each, Rfl: 1  Physical exam: Exam limited due to telemedicine Physical Exam Neurological:     Mental Status: She is alert.     Assessment and plan- Patient is a 33 y.o. female who we see for B12 and iron  deficiency.   Review of labs from 05/26/2024 show a Hgb of 12.5 with normal MCV.  Iron  saturation is 20%, Ferritin is 82 B12 has improved from 349 to 646 with the increased frequency of injections from once monthly to every 14 days.  Continue follow up with PCP and neurology  RTC 3 months lab only RTC 3 month  +- 3-7 days for virtual visit with me    Visit Diagnosis No diagnosis found.   Patient expressed understanding and was in agreement with this plan. She also understands that She can call clinic at any time with any questions, concerns, or complaints.   I discussed the assessment and treatment plan with the patient. The patient was provided an opportunity to ask questions and all were answered. The patient agreed with the plan and demonstrated an understanding of the instructions.   The patient was advised to call back or seek an in-person evaluation if the symptoms worsen or if the condition fails to improve as anticipated.   I spent 10 minutes face-to-face video visit time dedicated to the care of this patient on the date of this encounter to include pre-visit review of recent labs as well as face-to-face time with the patient, and post visit ordering of testing/documentation.    Thank you for allowing me to participate in the care of this very pleasant patient.   Hernandez Losasso PA-C

## 2024-06-25 ENCOUNTER — Ambulatory Visit: Payer: Self-pay | Admitting: Neurology

## 2024-06-25 ENCOUNTER — Ambulatory Visit (INDEPENDENT_AMBULATORY_CARE_PROVIDER_SITE_OTHER): Admitting: Neurology

## 2024-06-25 DIAGNOSIS — R202 Paresthesia of skin: Secondary | ICD-10-CM

## 2024-06-25 NOTE — Procedures (Signed)
 [facility]  [address] [place], [address]  [place], [place] [address] Tel: [phone] Fax: [phone] Test Date:  06/25/2024  Patient: Janice Vance DOB: February 26, 1991 Physician: Tonita Blanch, DO  Sex: Female Height: 5' 11 Ref Phys: Tonita Blanch, DO  ID#: [id number]   Technician:    History: This is a 33 year old female referred for evaluation of right sided paresthesias.  NCV & EMG Findings: Extensive electrodiagnostic testing of the right upper and lower extremity shows:  Right median, ulnar, mixed palmar, sural, and superficial peroneal sensory responses are within normal limits. Right median, ulnar, peroneal, and tibial motor responses are within normal limits. Right tibial H reflex study is within normal limits. There is no evidence of active or chronic motor axonal loss changes affecting any of the tested muscles.    Impression: This is a normal study of the right upper and lower extremities.  In particular, there is no evidence of a large fiber sensorimotor polyneuropathy, cervical/lumbosacral radiculopathy, or carpal tunnel syndrome.   ___________________________ Tonita Blanch, DO    Nerve Conduction Studies   Stim Site NR Peak (ms) Norm Peak (ms) O-P Amp (V) Norm O-P Amp  Right Median Anti Sensory (2nd Digit)  32 C  Wrist    3.0 <3.4 50.1 >20  Right Sup Peroneal Anti Sensory (Ant Lat Mall)  32 C  12 cm    2.6 <4.5 15.3 >5  Right Sural Anti Sensory (Lat Mall)  32 C  Calf    2.7 <4.5 25.2 >5  Right Ulnar Anti Sensory (5th Digit)  32 C  Wrist    2.8 <3.1 45.5 >12     Stim Site NR Onset (ms) Norm Onset (ms) O-P Amp (mV) Norm O-P Amp Site1 Site2 Delta-0 (ms) Dist (cm) Vel (m/s) Norm Vel (m/s)  Right Median Motor (Abd Poll Brev)  32 C  Wrist    3.4 <3.9 9.3 >6 Elbow Wrist 5.6 31.0 55 >50  Elbow    9.0  8.8         Right Peroneal Motor (Ext Dig Brev)  32 C  Ankle    4.3 <5.5 3.5 >3 B Fib Ankle 7.6 40.0 53 >40  B Fib    11.9  3.3  Poplt B Fib 1.9  10.0 53 >40  Poplt    13.8  3.2         Right Tibial Motor (Abd Hall Brev)  32 C  Ankle    4.5 <6.0 13.8 >8 Knee Ankle 8.5 46.0 54 >40  Knee    13.0  11.2         Right Ulnar Motor (Abd Dig Minimi)  32 C  Wrist    2.5 <3.1 9.0 >7 B Elbow Wrist 4.3 25.0 58 >50  B Elbow    6.8  8.4  A Elbow B Elbow 1.6 10.0 62 >50  A Elbow    8.4  8.2            Stim Site NR Peak (ms) Norm Peak (ms) P-T Amp (V) Site1 Site2 Delta-P (ms) Norm Delta (ms)  Right Median/Ulnar Palm Comparison (Wrist - 8cm)  32 C  Median Palm    1.7 <2.2 143.4 Median Palm Ulnar Palm 0.1   Ulnar Palm    1.6 <2.2 20.5       Electromyography   Side Muscle Ins.Act Fibs Fasc Recrt Amp Dur Poly Activation Comment  Right 1stDorInt Nml Nml Nml Nml Nml Nml Nml Nml N/A  Right PronatorTeres Nml  Nml Nml Nml Nml Nml Nml Nml N/A  Right Biceps Nml Nml Nml Nml Nml Nml Nml Nml N/A  Right Triceps Nml Nml Nml Nml Nml Nml Nml Nml N/A  Right Deltoid Nml Nml Nml Nml Nml Nml Nml Nml N/A  Right AntTibialis Nml Nml Nml Nml Nml Nml Nml Nml N/A  Right Gastroc Nml Nml Nml Nml Nml Nml Nml Nml N/A  Right Flex Dig Long Nml Nml Nml Nml Nml Nml Nml Nml N/A  Right RectFemoris Nml Nml Nml Nml Nml Nml Nml Nml N/A  Right GluteusMed Nml Nml Nml Nml Nml Nml Nml Nml N/A      Waveforms:

## 2024-07-04 NOTE — Patient Instructions (Signed)
It was great to see you again today!  

## 2024-07-04 NOTE — Progress Notes (Signed)
 [facility] [address], [address] [place], [place] [address] [phone]  Date:  07/06/2024   Name:  Janice Vance   DOB:  1991/04/03   MRN:  [id number]  PCP:  Watt Janice BROCKS, MD    Chief Complaint: No chief complaint on file.   History of Present Illness:  Janice Vance is a 33 y.o. very pleasant female patient who presents with the following:  Patient seen today for a work physical I saw her most recently in April of this year when she was dealing with some possible neurological changes in her right arm.  MRI of her head was basically reassuring and I had her follow-up with neurology They added an MRI of her cervical spine which was normal, Dr. Tobie recommended doing EMG testing which was completed earlier this month; all normal She also follows up with hematology for history of iron  deficiency anemia and B12 deficiency She saw genetic counseling due to family history of breast cancer-she is seeing the high risk breast cancer clinic at [facility], she is noted to have a lifetime risk of about 25% Her father was diagnosed with breast cancer at a young age, 38  -Flu shot Pap smear   Discussed the use of AI scribe software for clinical note transcription with the patient, who gave verbal consent to proceed.  History of Present Illness     Patient Active Problem List   Diagnosis Date Noted   Genetic testing 03/11/2024   Iron  deficiency anemia 12/25/2023   SVD (spontaneous vaginal delivery) 04/18/2021    Past Medical History:  Diagnosis Date   Anemia    Chicken pox    as a child   Frequent headaches    History of UTI    Hyperlipemia    Newborn product of IVF pregnancy    POTS (postural orthostatic tachycardia syndrome)     Past Surgical History:  Procedure Laterality Date   egg retrieval     IR RADIOLOGIST EVAL & MGMT  07/12/2023   IR RADIOLOGIST EVAL & MGMT  12/16/2023    Social  History   Tobacco Use   Smoking status: Never   Smokeless tobacco: Never  Vaping Use   Vaping status: Never Used  Substance Use Topics   Alcohol use: No   Drug use: No    Family History  Problem Relation Age of Onset   Diabetes Father    Breast cancer Father 25   Colon cancer Maternal Aunt 62 - 49   Melanoma Paternal Aunt 58       leg, back   Heart attack Paternal Uncle    Breast cancer Maternal Grandmother 60 - 69   Supraventricular tachycardia Maternal Grandmother    Heart failure Maternal Grandmother    Asthma Paternal Grandmother    Breast cancer Paternal Grandmother 10 - 59   Supraventricular tachycardia Paternal Grandmother    Heart attack Paternal Grandfather    Melanoma Maternal Great-grandmother    Thyroid  cancer Paternal Great-grandmother 73 - 89    Allergies  Allergen Reactions   Codeine Rash   Latex Rash   Penicillins Rash    Medication list has been reviewed and updated.  Current Outpatient Medications on File Prior to Visit  Medication Sig Dispense Refill   cyanocobalamin  (VITAMIN B12) 1000 MCG/ML injection Inject 1 mL (1,000 mcg total) into the muscle every 14 (fourteen) days. Inject 1 ml (1000 mcg) into the muscle weekly  x 4, then monthly. 8 mL 2   Multiple Vitamin (MULTIVITAMIN) capsule Take 1 capsule by mouth daily.     NEEDLE, DISP, 23 G (BD ECLIPSE NEEDLE) 23G X 1 MISC Inject 1 ml (1000 mcg) into the muscle every week x 4, then monthly. 10 each 1   NEEDLE, DISP, 25 G (BD SAFETYGLIDE NEEDLE) 25G X 5/8 MISC Inject 1 each into the muscle every 30 (thirty) days. 10 each 1   No current facility-administered medications on file prior to visit.    Review of Systems:  As per HPI- otherwise negative.   Physical Examination: There were no vitals filed for this visit. There were no vitals filed for this visit. There is no height or weight on file to calculate BMI. Ideal Body Weight:    GEN: no acute distress. HEENT: Atraumatic, Normocephalic.   Ears and Nose: No external deformity. CV: RRR, No M/G/R. No JVD. No thrill. No extra heart sounds. PULM: CTA B, no wheezes, crackles, rhonchi. No retractions. No resp. distress. No accessory muscle use. ABD: S, NT, ND, +BS. No rebound. No HSM. EXTR: No c/c/e PSYCH: Normally interactive. Conversant.    Assessment and Plan: No diagnosis found.  Assessment & Plan   Signed Janice Schroeder, MD

## 2024-07-06 ENCOUNTER — Ambulatory Visit (INDEPENDENT_AMBULATORY_CARE_PROVIDER_SITE_OTHER): Admitting: Family Medicine

## 2024-07-06 ENCOUNTER — Encounter: Payer: Self-pay | Admitting: Family Medicine

## 2024-07-06 VITALS — BP 108/64 | HR 72 | Temp 97.8°F | Ht 71.0 in | Wt 180.0 lb

## 2024-07-06 DIAGNOSIS — Z0000 Encounter for general adult medical examination without abnormal findings: Secondary | ICD-10-CM | POA: Diagnosis not present

## 2024-07-06 DIAGNOSIS — Z131 Encounter for screening for diabetes mellitus: Secondary | ICD-10-CM

## 2024-07-06 DIAGNOSIS — Z13220 Encounter for screening for lipoid disorders: Secondary | ICD-10-CM

## 2024-07-06 DIAGNOSIS — M62830 Muscle spasm of back: Secondary | ICD-10-CM

## 2024-07-06 LAB — BASIC METABOLIC PANEL WITH GFR
BUN: 7 mg/dL (ref 6–23)
CO2: 28 meq/L (ref 19–32)
Calcium: 9.2 mg/dL (ref 8.4–10.5)
Chloride: 104 meq/L (ref 96–112)
Creatinine, Ser: 0.46 mg/dL (ref 0.40–1.20)
GFR: 124.5 mL/min (ref >60.00)
Glucose, Bld: 89 mg/dL (ref 70–99)
Potassium: 4.1 meq/L (ref 3.5–5.1)
Sodium: 140 meq/L (ref 135–145)

## 2024-07-06 LAB — LIPID PANEL
Cholesterol: 189 mg/dL (ref <200)
HDL: 40 mg/dL — ABNORMAL LOW (ref >=50)
LDL Cholesterol (Calc): 123 mg/dL — ABNORMAL HIGH
Non-HDL Cholesterol (Calc): 149 mg/dL — ABNORMAL HIGH (ref <130)
Total CHOL/HDL Ratio: 4.7 (calc) (ref <5.0)
Triglycerides: 143 mg/dL (ref <150)

## 2024-07-07 LAB — HEMOGLOBIN A1C: Hgb A1c MFr Bld: 5 % (ref 4.6–6.5)

## 2024-07-15 ENCOUNTER — Ambulatory Visit: Payer: Self-pay | Admitting: Family Medicine

## 2024-07-15 NOTE — Telephone Encounter (Signed)
 Please see other message.

## 2024-07-24 ENCOUNTER — Encounter: Payer: Self-pay | Admitting: Family Medicine

## 2024-07-28 ENCOUNTER — Ambulatory Visit

## 2024-07-31 ENCOUNTER — Encounter: Payer: Self-pay | Admitting: Family Medicine

## 2024-08-03 NOTE — Therapy (Signed)
 OUTPATIENT PHYSICAL THERAPY CERVICAL EVALUATION   Patient Name: Janice Vance MRN: [id number] DOB:[birth year], 33 y.o., female Today's Date: 08/05/2024   END OF SESSION:  PT End of Session - 08/05/24 0853     Visit Number 1    Date for Recertification  09/30/24    PT Start Time 0814    PT Stop Time 0850    PT Time Calculation (min) 36 min          Past Medical History:  Diagnosis Date   Anemia    Chicken pox    as a child   Frequent headaches    History of UTI    Hyperlipemia    Newborn product of IVF pregnancy    POTS (postural orthostatic tachycardia syndrome)    Past Surgical History:  Procedure Laterality Date   egg retrieval     IR RADIOLOGIST EVAL & MGMT  07/12/2023   IR RADIOLOGIST EVAL & MGMT  12/16/2023   Patient Active Problem List   Diagnosis Date Noted   Genetic testing 03/11/2024   Iron  deficiency anemia 12/25/2023   SVD (spontaneous vaginal delivery) 04/18/2021    PCP: Watt Harlene BROCKS, MD   REFERRING PROVIDER: Watt Harlene BROCKS, MD   REFERRING DIAG: [phone] (ICD-10-CM) - Spasm of left trapezius muscle  THERAPY DIAG:  Cervicalgia  Abnormal posture  Muscle weakness (generalized)  RATIONALE FOR EVALUATION AND TREATMENT: Rehabilitation  ONSET DATE: 3 months  NEXT MD VISIT:    SUBJECTIVE:                                                                                                                                                                                                         SUBJECTIVE STATEMENT: 33 y/o referred to PT for L trapezius spasm.  States started noticing it about 3 months ago for no apparent reason. Denies any trauma or change in activity level.  States the pain is in the L upper trap area and radiates up the L side of her neck to the base of the skull and mastoid process.  The pain is fairly constant of variable intensity.   Always feels some level of deep aching, but gets more of a shooting pain with  lifting activities.  She is L handed.    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   She has 2 children.   One is 55 month old and  the other is a 34 y/o.  She does a lot of picking them up and carrying them.   States they recently went to [place] and that her L neck pain greatly increased from carrying the 63 month old.  Running, walking, driving, riding, picking up the children all increase her pain.     She is having pain with L rotation and R SB.  She has tried chiropractic and massage therapy but it didn't help her pain.   She was told she has a curve like a kyphosis.  She likes to run for exercises and does some half marathons, but hasn't been able to run due to the increased pain.   OF NOTE--she was having RUE numbness and tingling at one point in the past and had a brain MRI as well as EMG testing to r/o MS and other neuromuscular d/o.   All testing was negative.  She was found to have some thyroid  issues and iron  deficiency per her report.   Now that she has been treated for them her RUE issues have resolved per her report.  Hand dominance: Left  PAIN: Are you having pain? Yes: NPRS scale: 3/10 now;  7/10 worst Pain location: L neck/UT Pain description: aching, shooting Aggravating factors: leaning to the L to pick up things, lying on L side Relieving factors: lying on R side avoiding offending activities like running, neck movements, lifting  PERTINENT HISTORY:  Anemia, POTS, h/o childhood chicken pox  PRECAUTIONS: None  RED FLAGS: None  HAND DOMINANCE: Left  WEIGHT BEARING RESTRICTIONS: No  FALLS:  Has patient fallen in last 6 months? 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   CONTRAST:  7.5mL GADAVIST  GADOBUTROL  1 MMOL/ML IV SOLN   COMPARISON:  Head CT 07/02/2017.   FINDINGS: Brain:   Cerebral volume is normal.   There are two tiny nonspecific foci of T2 FLAIR hyperintense signal abnormality within the anterior right frontal lobe white matter (series 12, images 14 and 16).   No cortical encephalomalacia is identified.   There is no acute infarct.   No evidence of an intracranial mass.   No extra-axial fluid collection.   No midline shift.   No pathologic intracranial enhancement identified.   Vascular: Maintained flow voids within the proximal large arterial vessels.   Skull and upper cervical spine: No focal worrisome marrow lesion.   Sinuses/Orbits: No mass or acute finding within the imaged orbits. No significant paranasal sinus disease.   IMPRESSION: 1.  No evidence of an acute intracranial abnormality. 2. Two tiny nonspecific chronic insults within the anterior right frontal lobe white matter. 3. Otherwise unremarkable MRI appearance of brain.  NCV & EMG Findings: Extensive electrodiagnostic testing of the right upper and lower extremity shows:  Right median, ulnar, mixed palmar, sural, and superficial peroneal sensory responses are within normal limits. Right median, ulnar, peroneal, and tibial motor responses are within normal limits. Right tibial H reflex study is  within normal limits. There is no evidence of active or chronic motor axonal loss changes affecting any of the tested muscles.      Impression: This is a normal study of the right upper and lower extremities.  In particular, there is no evidence of a large fiber sensorimotor polyneuropathy, cervical/lumbosacral radiculopathy, or carpal tunnel syndrome.CLINICAL DATA:  Numbness and tingling in the right arm.   EXAM: CERVICAL SPINE - COMPLETE 4+ VIEW   COMPARISON:  None Available.   FINDINGS: There is no evidence of cervical spine fracture or prevertebral soft tissue swelling. Alignment is normal. No other significant bone abnormalities are identified.   IMPRESSION: Negative cervical spine radiographs.     Electronically Signed   By: Camellia Candle M.D.   On: 12/18/2023 11:18  PATIENT SURVEYS:  NDI:  NECK DISABILITY INDEX  Date: 08/05/24 Score  Pain intensity 1 = The pain is very mild at the moment  2. Personal care (washing, dressing, etc.) 0 = I can look after myself normally without causing extra pain  3. Lifting 4 =  I can only lift very light weights  4. Reading 0 = I can read as much as I want to with no pain in my neck  5. Headaches 1 =  I have slight headaches, which come infrequently  6. Concentration 1 =  I can concentrate fully when I want to with slight difficulty   7. Work 4 = I can hardly do any work at all  8. Driving 1 =  I can drive my car as long as I want with slight pain in my neck  9. Sleeping 1 = My sleep is slightly disturbed (less than 1 hr sleepless)  10. Recreation 1 =  I am able to engage in all my recreation activities, with some pain in my neck  Total 14/50   Minimum Detectable Change (90% confidence): 5 points or 10% points  COGNITION: Overall cognitive status: Within functional limits for tasks assessed  SENSATION: WFL  POSTURE:  rounded shoulders and forward head;  moderate upper thoracic kyphosis   PALPATION: TTP over the L upper trap muscle belly with palpable trigger point.   Scalenes and SCM are tender but do not reproduce her pain like Tp palpation does.  Mastoid  process is not significantly tender   CERVICAL ROM:   Active ROM A/PROM  eval  Flexion 100%  Extension 50%; stiff  Right lateral flexion 50%; painful  Left lateral flexion 75%; nonpainful  Right rotation 90%  Left rotation 50%; painful   (Blank rows = not tested)  UPPER EXTREMITY ROM:  Active ROM Right eval Left eval  Shoulder flexion 180 180  Shoulder extension    Shoulder abduction    Shoulder adduction    Shoulder internal rotation T6 T6  Shoulder external rotation T2 T2  Elbow flexion    Elbow extension    Wrist flexion    Wrist extension    Wrist ulnar deviation    Wrist radial deviation    Wrist pronation    Wrist supination     (Blank rows = not tested)  UPPER EXTREMITY MMT:  MMT Right eval Left eval  Shoulder flexion 5 5  Shoulder extension    Shoulder abduction 5 4  Shoulder adduction    Shoulder internal rotation 5 5  Shoulder external rotation 5 4  Middle trapezius 4 4  Lower trapezius    Elbow flexion 5 5  Elbow extension 5 5  Wrist flexion 5  5  Wrist extension 5 4  Wrist ulnar deviation    Wrist radial deviation    Wrist pronation    Wrist supination    Grip strength =B =B  Finger abduction WNL WNL   (Blank rows = not tested)  CERVICAL SPECIAL TESTS:  Spurling's test: Negative and Distraction test: Negative  FUNCTIONAL TESTS:  N/a    TODAY'S TREATMENT:  08/05/24 SELF CARE: Provided education on PT POC progression, initial HEP  (Patient is dressed in nice work clothing so is unable to do any manual therapy, US /ESU combo treatment, taping, dry needling etc.    She will dress in gym clothes next visit)   PATIENT EDUCATION:  Education details: PT eval findings, anticipated POC, and initial HEP  Person educated: Patient Education method: Explanation, Demonstration, Verbal cues, Tactile cues, Handouts, and MedBridgeGO app access provided Education comprehension: verbalized understanding, verbal cues required, tactile cues required, and  needs further education  HOME EXERCISE PROGRAM: Access Code: [email or web address] URL: [email or web address] Date: 08/05/2024 Prepared by: Garnette Montclair  Exercises - Standing Cervical Retraction  - 1 x daily - 7 x weekly - 3 sets - 10 reps - Seated Cervical Retraction and Extension  - 1 x daily - 7 x weekly - 3 sets - 10 reps - Cervical Retraction Prone on Elbows  - 1 x daily - 7 x weekly - 3 sets - 10 reps - Shoulder External Rotation and Scapular Retraction with Resistance  - 1 x daily - 7 x weekly - 3 sets - 10 reps - Standing Shoulder Row with Anchored Resistance  - 1 x daily - 7 x weekly - 3 sets - 10 reps  ASSESSMENT:  CLINICAL IMPRESSION: Janice Vance is a 33 y.o. female who was referred to physical therapy for evaluation and treatment for L upper trapezius strain.   Patient reports onset of L sided neck pain beginning a few months ago for no apparent reason. Pain is worse with lifting/carrying her children or doing any lifting in general especially when leaning to the L.   She is L handed.   She has tried chiropractic and massage therapy without relief.   She has a palpable L UT trigger point and deep pressure to this reproduces her pain.  She also has trigger points in the middle trap/rhomboid area along the medial scapular border, but they are not the source of her pain.   R cervical sidebending and L rotation increase her pain.   Compression and distraction tests do not seem to make any change in her symptoms.   She denies any radiating pain into the LUE below shoulder level.   RTC testing is negative.   She is an excellent candidate for PT and especially dry needling as her pain seems to be more myofascial in nature.   Her upper thoracic kyphosis and postural deficits are likely precipitating factors for the pain.  Patient has deficits in cervical ROM, postural muscle/middle trap strength, abnormal posture, and TTP with abnormal muscle tension  which are interfering with  ADLs and are impacting quality of life.  On NDI patient scored 14/50 demonstrating 18% or mild disability.  Christine will benefit from skilled PT to address above deficits to improve mobility and activity tolerance with decreased pain interference.  OBJECTIVE IMPAIRMENTS: decreased strength, postural dysfunction, and pain.   ACTIVITY LIMITATIONS: carrying, lifting, and running  PARTICIPATION LIMITATIONS: cleaning, laundry, driving, and occupation  PERSONAL FACTORS: Time since onset of injury/illness/exacerbation and 1-2 comorbidities: anemia, POTS, h/o childhood  chicken pox are also affecting patient's functional outcome.   REHAB POTENTIAL: Good  CLINICAL DECISION MAKING: Evolving/moderate complexity  EVALUATION COMPLEXITY: Moderate   GOALS: Goals reviewed with patient? Yes  SHORT TERM GOALS: Target date: 09/02/2024   Patient will be independent with initial HEP to improve outcomes and carryover.  Baseline: 100% PT assist required for correct completion Goal status: INITIAL  2.  Patient will report 25% improvement in neck pain to improve QOL.  Baseline: 7/10 worst Goal status: INITIAL  3.  Patient will demonstrate improved strength by 1/2 grade in her middle trap and shoulder external rotators Baseline: see strength tables above Goal status: INITIAL  LONG TERM GOALS: Target date: 09/30/2024   Patient will be independent with ongoing/advanced HEP for self-management at home.  Baseline: no advanced HEP yet Goal status: INITIAL  2.  Patient will demonstrate improved posture to decrease muscle imbalance. Baseline: upper thoracic kyphosis and forward head posture Goal status: INITIAL  3.  Patient will report 50-75% improvement in neck pain to improve QOL.  Baseline: 7/10 worst Goal status: INITIAL  5.  Patient will demonstrate full pain free cervical ROM for safety with driving.  Baseline: Refer to above cervical ROM table Goal status: INITIAL  6.  Patient will report </=  18% on NDI (MCID = 10%) to demonstrate improved functional ability.  Baseline: 28% Goal status: INITIAL  7.  Patient will demonstrate 4+ to 5/5 strength in her posterior scapular/shoulder musculature   Baseline: see strength tables above Goal status: INITIAL   PLAN:  PT FREQUENCY: 1-2x/week  PT DURATION: 8 weeks  PLANNED INTERVENTIONS: 97110-Therapeutic exercises, 97530- Therapeutic activity, 97112- Neuromuscular re-education, 97535- Self Care, 02859- Manual therapy, [id number]- Electrical stimulation (unattended), 97035- Ultrasound, 02987- Traction (mechanical), [id number]- Ionotophoresis 4mg /ml Dexamethasone , 79439 (1-2 muscles), 20561 (3+ muscles)- Dry Needling, Patient/Family education, Taping, Joint mobilization, and Spinal mobilization  PLAN FOR NEXT SESSION: dry needling to L UT trigger point, posterior/postural muscle strengthening, foam roll stretching and exercises, check vertebral mobility/PA glides (unable to check today due to dress clothes); modalities and KT tape PRN   Josel Keo, PT 08/05/2024, 8:54 AM

## 2024-08-05 ENCOUNTER — Ambulatory Visit: Attending: Family Medicine | Admitting: Rehabilitation

## 2024-08-05 ENCOUNTER — Other Ambulatory Visit: Payer: Self-pay

## 2024-08-05 ENCOUNTER — Encounter: Payer: Self-pay | Admitting: Family Medicine

## 2024-08-05 DIAGNOSIS — M6281 Muscle weakness (generalized): Secondary | ICD-10-CM | POA: Diagnosis present

## 2024-08-05 DIAGNOSIS — M62830 Muscle spasm of back: Secondary | ICD-10-CM | POA: Insufficient documentation

## 2024-08-05 DIAGNOSIS — J3489 Other specified disorders of nose and nasal sinuses: Secondary | ICD-10-CM

## 2024-08-05 DIAGNOSIS — R293 Abnormal posture: Secondary | ICD-10-CM | POA: Insufficient documentation

## 2024-08-05 DIAGNOSIS — M542 Cervicalgia: Secondary | ICD-10-CM | POA: Diagnosis present

## 2024-08-05 DIAGNOSIS — J111 Influenza due to unidentified influenza virus with other respiratory manifestations: Secondary | ICD-10-CM

## 2024-08-05 MED ORDER — OSELTAMIVIR PHOSPHATE 75 MG PO CAPS: 75.0000 mg | ORAL_CAPSULE | Freq: Two times a day (BID) | ORAL | 0 refills | Status: DC

## 2024-08-06 ENCOUNTER — Ambulatory Visit: Admitting: Physical Therapy

## 2024-08-06 ENCOUNTER — Encounter: Payer: Self-pay | Admitting: Physical Therapy

## 2024-08-06 DIAGNOSIS — M6281 Muscle weakness (generalized): Secondary | ICD-10-CM

## 2024-08-06 DIAGNOSIS — M542 Cervicalgia: Secondary | ICD-10-CM

## 2024-08-06 DIAGNOSIS — R293 Abnormal posture: Secondary | ICD-10-CM

## 2024-08-06 NOTE — Therapy (Signed)
 OUTPATIENT PHYSICAL THERAPY CERVICAL EVALUATION   Patient Name: Janice Vance MRN: [id number] DOB:Mar 28, 1991, 33 y.o., female Today's Date: 08/06/2024   END OF SESSION:  PT End of Session - 08/06/24 0805     Visit Number 2    Date for Recertification  09/30/24    Authorization Type BCBS    PT Start Time 0806    PT Stop Time 0847    PT Time Calculation (min) 41 min    Activity Tolerance Patient tolerated treatment well    Behavior During Therapy WFL for tasks assessed/performed           Past Medical History:  Diagnosis Date   Anemia    Chicken pox    as a child   Frequent headaches    History of UTI    Hyperlipemia    Newborn product of IVF pregnancy    POTS (postural orthostatic tachycardia syndrome)    Past Surgical History:  Procedure Laterality Date   egg retrieval     IR RADIOLOGIST EVAL & MGMT  07/12/2023   IR RADIOLOGIST EVAL & MGMT  12/16/2023   Patient Active Problem List   Diagnosis Date Noted   Genetic testing 03/11/2024   Iron  deficiency anemia 12/25/2023   SVD (spontaneous vaginal delivery) 04/18/2021    PCP: Watt Harlene BROCKS, MD   REFERRING PROVIDER: Watt Harlene BROCKS, MD   REFERRING DIAG: [phone] (ICD-10-CM) - Spasm of left trapezius muscle  THERAPY DIAG:  Cervicalgia  Abnormal posture  Muscle weakness (generalized)  RATIONALE FOR EVALUATION AND TREATMENT: Rehabilitation  ONSET DATE: 3 months  NEXT MD VISIT:    SUBJECTIVE:                                                                                                                                                                                                         SUBJECTIVE STATEMENT: Patient denies pain today but reports still feeling a knot in her L upper shoulder.  Interested in trying TPDN.   EVAL: 32 y/o referred to PT for L trapezius spasm.  States started noticing it about 3 months ago for no apparent reason. Denies any trauma or change in activity  level.  States the pain is in the L upper trap area and radiates up the L side of her neck to the base of the skull and mastoid process.  The pain is fairly constant of variable intensity.   Always feels some level of deep aching, but gets more of a shooting pain with lifting activities.  She is L handed.  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   She has 2 children.   One is 63 month old and the other is a 34 y/o.  She does a lot of picking them up and carrying them.   States they recently went to [place] and that her L neck pain greatly increased from carrying the 7 month old.  Running, walking, driving, riding, picking up the children all increase her pain.     She is having pain with L rotation and R SB.  She has tried chiropractic and massage therapy but it didn't help her pain.   She was told she has a curve like a kyphosis.  She likes to run for exercises and does some half marathons, but hasn't been able to run due to the increased pain.   OF NOTE--she was having RUE numbness and tingling at one point in the past and had a brain MRI as well as EMG testing to r/o MS and other neuromuscular d/o.   All testing was negative.  She was found to have some thyroid  issues and iron  deficiency per her report.   Now that she has been treated for them her RUE issues have resolved per her report.  Hand dominance: Left  PAIN: Are you having pain? Yes: NPRS scale: 0/10  Pain location: L neck/UT Pain description: aching, shooting Aggravating factors: leaning to the L to pick up things, lying on L side Relieving factors: lying on R side avoiding offending activities like running, neck movements, lifting  PERTINENT HISTORY:  Anemia, POTS, h/o childhood chicken pox  PRECAUTIONS: None  RED FLAGS: None  HAND DOMINANCE: Left  WEIGHT BEARING RESTRICTIONS: No  FALLS:  Has  patient fallen in last 6 months? 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   CONTRAST:  7.5mL GADAVIST  GADOBUTROL  1 MMOL/ML IV SOLN   COMPARISON:  Head CT 07/02/2017.   FINDINGS: Brain:   Cerebral volume is normal.   There are two tiny nonspecific foci of T2 FLAIR hyperintense signal abnormality within the anterior right frontal lobe white matter (series 12, images 14 and 16).   No cortical encephalomalacia is identified.   There is no acute infarct.   No evidence of an intracranial mass.   No extra-axial fluid collection.   No midline shift.   No pathologic intracranial enhancement identified.   Vascular: Maintained flow voids within the proximal large arterial vessels.   Skull and upper cervical spine: No focal worrisome marrow lesion.   Sinuses/Orbits: No mass or acute finding within the imaged orbits. No significant paranasal sinus disease.   IMPRESSION: 1.  No evidence of an acute intracranial abnormality. 2. Two tiny nonspecific chronic insults within the anterior right frontal lobe white matter. 3. Otherwise unremarkable MRI appearance of brain.  NCV & EMG Findings:  Extensive electrodiagnostic testing of the right upper and lower extremity shows:  Right median, ulnar, mixed palmar, sural, and superficial peroneal sensory responses are within normal limits. Right median,  ulnar, peroneal, and tibial motor responses are within normal limits. Right tibial H reflex study is within normal limits. There is no evidence of active or chronic motor axonal loss changes affecting any of the tested muscles.     Impression: This is a normal study of the right upper and lower extremities.  In particular, there is no evidence of a large fiber sensorimotor polyneuropathy, cervical/lumbosacral radiculopathy, or carpal tunnel syndrome.CLINICAL DATA:  Numbness and tingling in the right arm.   EXAM: CERVICAL SPINE - COMPLETE 4+ VIEW   COMPARISON:  None Available.   FINDINGS: There is no evidence of cervical spine fracture or prevertebral soft tissue swelling. Alignment is normal. No other significant bone abnormalities are identified.   IMPRESSION: Negative cervical spine radiographs.     Electronically Signed   By: Camellia Candle M.D.   On: 12/18/2023 11:18  PATIENT SURVEYS:  NDI:  NECK DISABILITY INDEX  Date:  Score - 08/05/24  Pain intensity 1 = The pain is very mild at the moment  2. Personal care (washing, dressing, etc.) 0 = I can look after myself normally without causing extra pain  3. Lifting 4 =  I can only lift very light weights  4. Reading 0 = I can read as much as I want to with no pain in my neck  5. Headaches 1 =  I have slight headaches, which come infrequently  6. Concentration 1 =  I can concentrate fully when I want to with slight difficulty   7. Work 4 = I can hardly do any work at all  8. Driving 1 =  I can drive my car as long as I want with slight pain in my neck  9. Sleeping 1 = My sleep is slightly disturbed (less than 1 hr sleepless)  10. Recreation 1 =  I am able to engage in all my recreation activities, with some pain in my neck  Total 14/50   Minimum Detectable Change (90% confidence): 5 points or 10% points  COGNITION: Overall cognitive status: Within functional limits for tasks assessed  SENSATION: WFL  POSTURE:  rounded  shoulders and forward head;  moderate upper thoracic kyphosis   PALPATION: TTP over the L upper trap muscle belly with palpable trigger point.   Scalenes and SCM are tender but do not reproduce her pain like Tp palpation does.  Mastoid process is not significantly tender   CERVICAL ROM:   Active ROM A/PROM  eval  Flexion 100%  Extension 50%; stiff  Right lateral flexion 50%; painful  Left lateral flexion 75%; nonpainful  Right rotation 90%  Left rotation 50%; painful   (Blank rows = not tested)  UPPER EXTREMITY ROM:  Active ROM Right eval Left eval  Shoulder flexion 180 180  Shoulder extension    Shoulder abduction    Shoulder adduction    Shoulder internal rotation T6 T6  Shoulder external rotation T2 T2  Elbow flexion    Elbow extension    Wrist flexion    Wrist extension    Wrist ulnar deviation    Wrist radial deviation    Wrist pronation    Wrist supination     (Blank rows = not tested)  UPPER EXTREMITY MMT:  MMT Right eval Left eval  Shoulder flexion 5 5  Shoulder extension  Shoulder abduction 5 4  Shoulder adduction    Shoulder internal rotation 5 5  Shoulder external rotation 5 4  Middle trapezius 4 4  Lower trapezius    Elbow flexion 5 5  Elbow extension 5 5  Wrist flexion 5 5  Wrist extension 5 4  Wrist ulnar deviation    Wrist radial deviation    Wrist pronation    Wrist supination    Grip strength =B =B  Finger abduction WNL WNL   (Blank rows = not tested)  CERVICAL SPECIAL TESTS:  Spurling's test: Negative and Distraction test: Negative  FUNCTIONAL TESTS:  N/a    TODAY'S TREATMENT:   08/06/2024  THERAPEUTIC EXERCISE: To improve strength, endurance, ROM, and flexibility.  Demonstration, verbal and tactile cues throughout for technique.  UBE: L2.0 x 3' each fwd & back Seated L UT stretch x 30 Seated L UT stretch + slight overpressure x 30 Standing L UT stretch with arm held behind back 3 x 30 - preferred Seated L LS stretch 3 x  30 S/L L open book stretch 10 x 5  TRIGGER POINT DRY NEEDLING: Treatment instructions/education: Initial Treatment: Pt instructed on Dry Needling rational, procedures, and possible side effects. Pt instructed to expect mild to moderate muscle soreness later in the day and/or into the next day.  Pt instructed in methods to reduce muscle soreness. Pt instructed to continue prescribed HEP. Patient was educated on signs and symptoms of infection and other risk factors and advised to seek medical attention should they occur.  Patient verbalized understanding of these instructions and education.  Education Handout Provided: Yes Consent: Patient Verbal Consent Given: Yes Treatment: Muscles Treated: L UT - multiple sticks Utilized skilled palpation to identify bony landmarks and trigger points along with monitoring of soft tissue during DN. Electrical Stimulation Performed: No Treatment Response/Outcome: Twitch response elicited, Palpable increase in muscle length, Decreased tissue resistance noted, and Decreased pain/TTP  MANUAL THERAPY: To promote normalized muscle tension, improved flexibility, increased ROM, and reduced pain. STM/DTM, manual TPR and pin & stretch to muscles addressed with DN   SELF CARE:   Provided education on what to expect following TPDN including soreness lasting up to 24 to 48 hours.  Encourage patient to increase hydration, drinking extra water today, and continue with current HEP to help mitigate post TPDN soreness and encouraged normalization of muscle tension.  Patient made aware that she could use heat or ice as needed to address any muscle soreness from TPDN.   08/05/24 SELF CARE: Provided education on PT POC progression, initial HEP  (Patient is dressed in nice work clothing so is unable to do any manual therapy, US /ESU combo treatment, taping, dry needling etc.    She will dress in gym clothes next visit)   PATIENT EDUCATION:  Education details: HEP update  - UT/LS and open book stretches, postural awareness, role of TPDN, TPDN rational, procedure, outcomes, potential side effects, and recommended post-treatment exercises/activity, and fee schedule for TPDN and lack of insurance coverage requiring payment at time of service  Person educated: Patient Education method: Explanation, Demonstration, Verbal cues, Tactile cues, Handouts, and MedBridge[employer] app updated Education comprehension: verbalized understanding, verbal cues required, tactile cues required, and needs further education  HOME EXERCISE PROGRAM: Access Code: [email or web address] URL: [email or web address] Date: 08/06/2024 Prepared by: Elijah Hidden  Exercises - Standing Cervical Retraction  - 1 x daily - 7 x weekly - 3 sets - 10 reps - Seated Cervical Retraction and Extension  - 1 x daily -  7 x weekly - 3 sets - 10 reps - Cervical Retraction Prone on Elbows  - 1 x daily - 7 x weekly - 3 sets - 10 reps - Shoulder External Rotation and Scapular Retraction with Resistance  - 1 x daily - 7 x weekly - 3 sets - 10 reps - Standing Shoulder Row with Anchored Resistance  - 1 x daily - 7 x weekly - 3 sets - 10 reps - Upper Trapezius Stretch (Mirrored)  - 2-3 x daily - 7 x weekly - 3 reps - 30 sec hold - Gentle Levator Scapulae Stretch  - 2-3 x daily - 7 x weekly - 3 reps - 30 sec hold - Sidelying Thoracic Rotation with Open Book  - 1 x daily - 7 x weekly - 2-3 sets - 10 reps - 5 sec hold   ASSESSMENT:  CLINICAL IMPRESSION: Janice Vance expressed interest in trying TPDN for the knot/TP in her L UT.  After explanation of TPDN rational, procedures, outcomes and potential side effects, patient verbalized consent to TPDN treatment in conjunction with manual STM/DTM and TPR to reduce ttp/muscle tension. Muscles treated as indicated above. TPDN produced normal response with good twitches elicited resulting in palpable reduction in pain/ttp and muscle tension. Pt educated to expect mild to moderate muscle  soreness for up to 24-48 hrs and instructed to continue prescribed HEP and current activity level with pt verbalizing understanding of these instructions. MT and TPDN followed by review of initial HEP and instruction in stretches for upper shoulder to promote further muscle relaxation/relief from muscle spasm. Janice Vance will benefit from continued skilled PT to address ongoing abnormal muscle tension, postural and strength deficits to improve mobility and activity tolerance with decreased pain interference.    EVAL: Janice Vance is a 33 y.o. female who was referred to physical therapy for evaluation and treatment for L upper trapezius strain.   Patient reports onset of L sided neck pain beginning a few months ago for no apparent reason. Pain is worse with lifting/carrying her children or doing any lifting in general especially when leaning to the L.   She is L handed.   She has tried chiropractic and massage therapy without relief.   She has a palpable L UT trigger point and deep pressure to this reproduces her pain.  She also has trigger points in the middle trap/rhomboid area along the medial scapular border, but they are not the source of her pain.   R cervical sidebending and L rotation increase her pain.   Compression and distraction tests do not seem to make any change in her symptoms.   She denies any radiating pain into the LUE below shoulder level.   RTC testing is negative.   She is an excellent candidate for PT and especially dry needling as her pain seems to be more myofascial in nature.   Her upper thoracic kyphosis and postural deficits are likely precipitating factors for the pain.  Patient has deficits in cervical ROM, postural muscle/middle trap strength, abnormal posture, and TTP with abnormal muscle tension  which are interfering with ADLs and are impacting quality of life.  On NDI patient scored 14/50 demonstrating 18% or mild disability.  Janice Vance will benefit from skilled PT to address  above deficits to improve mobility and activity tolerance with decreased pain interference.  OBJECTIVE IMPAIRMENTS: decreased strength, postural dysfunction, and pain.   ACTIVITY LIMITATIONS: carrying, lifting, and running  PARTICIPATION LIMITATIONS: cleaning, laundry, driving, and occupation  PERSONAL FACTORS: Time  since onset of injury/illness/exacerbation and 1-2 comorbidities: anemia, POTS, h/o childhood chicken pox are also affecting patient's functional outcome.   REHAB POTENTIAL: Good  CLINICAL DECISION MAKING: Evolving/moderate complexity  EVALUATION COMPLEXITY: Moderate   GOALS: Goals reviewed with patient? Yes  SHORT TERM GOALS: Target date: 09/02/2024   Patient will be independent with initial HEP to improve outcomes and carryover.  Baseline: 100% PT assist required for correct completion Goal status: IN PROGRESS - 08/06/24  2.  Patient will report 25% improvement in neck pain to improve QOL.  Baseline: 7/10 worst Goal status: INITIAL  3.  Patient will demonstrate improved strength by 1/2 grade in her middle trap and shoulder external rotators Baseline: see strength tables above Goal status: INITIAL  LONG TERM GOALS: Target date: 09/30/2024   Patient will be independent with ongoing/advanced HEP for self-management at home.  Baseline: no advanced HEP yet Goal status: INITIAL  2.  Patient will demonstrate improved posture to decrease muscle imbalance. Baseline: upper thoracic kyphosis and forward head posture Goal status: INITIAL  3.  Patient will report 50-75% improvement in neck pain to improve QOL.  Baseline: 7/10 worst Goal status: INITIAL  5.  Patient will demonstrate full pain free cervical ROM for safety with driving.  Baseline: Refer to above cervical ROM table Goal status: INITIAL  6.  Patient will report </= 18% on NDI (MCID = 10%) to demonstrate improved functional ability.  Baseline: 28% Goal status: INITIAL  7.  Patient will demonstrate  4+ to 5/5 strength in her posterior scapular/shoulder musculature   Baseline: see strength tables above Goal status: INITIAL   PLAN:  PT FREQUENCY: 1-2x/week  PT DURATION: 8 weeks  PLANNED INTERVENTIONS: 97110-Therapeutic exercises, 97530- Therapeutic activity, 97112- Neuromuscular re-education, 97535- Self Care, 02859- Manual therapy, G0283- Electrical stimulation (unattended), 97035- Ultrasound, 02987- Traction (mechanical), [id number]- Ionotophoresis 4mg /ml Dexamethasone , 79439 (1-2 muscles), 20561 (3+ muscles)- Dry Needling, Patient/Family education, Taping, Joint mobilization, and Spinal mobilization  PLAN FOR NEXT SESSION: Assess response to TPDN, further TPDN to L/R UT and LS as indicated and benefit noted, posterior/postural muscle strengthening, foam roll stretching and exercises, check vertebral mobility/PA glides (unable to check on eval due to dress clothes); modalities and KT tape PRN   Jeorgia Helming M Bodhi Moradi, PT 08/06/2024, 8:50 AM

## 2024-08-10 MED ORDER — AZITHROMYCIN 250 MG PO TABS: ORAL_TABLET | ORAL | 0 refills | Status: AC

## 2024-08-10 NOTE — Addendum Note (Signed)
 Addended by: WATT RAISIN C on: 08/10/2024 11:41 AM   Modules accepted: Orders

## 2024-08-11 ENCOUNTER — Ambulatory Visit

## 2024-08-17 ENCOUNTER — Ambulatory Visit: Admitting: Physical Therapy

## 2024-08-19 ENCOUNTER — Ambulatory Visit: Admitting: Physical Therapy

## 2024-08-20 ENCOUNTER — Encounter: Payer: Self-pay | Admitting: Hematology & Oncology

## 2024-08-24 ENCOUNTER — Ambulatory Visit: Payer: Self-pay | Attending: Family Medicine | Admitting: Rehabilitation

## 2024-08-24 DIAGNOSIS — R293 Abnormal posture: Secondary | ICD-10-CM | POA: Insufficient documentation

## 2024-08-24 DIAGNOSIS — M542 Cervicalgia: Secondary | ICD-10-CM | POA: Insufficient documentation

## 2024-08-24 DIAGNOSIS — M6281 Muscle weakness (generalized): Secondary | ICD-10-CM | POA: Insufficient documentation

## 2024-08-25 ENCOUNTER — Encounter: Payer: Self-pay | Admitting: Family Medicine

## 2024-08-25 ENCOUNTER — Ambulatory Visit: Admitting: Family Medicine

## 2024-08-25 VITALS — BP 92/60 | HR 81 | Temp 98.2°F | Resp 16 | Ht 69.0 in

## 2024-08-25 DIAGNOSIS — L508 Other urticaria: Secondary | ICD-10-CM | POA: Diagnosis not present

## 2024-08-25 NOTE — Patient Instructions (Addendum)
 Hives (urticaria)  Claritin 10 mg twice a day and famotidine (Pepcid) 20 mg twice a day. If no symptoms for 7-14 days then decrease to Claritin 10 mg twice a day and famotidine (Pepcid) 20 mg once a day.  If no symptoms for 7-14 days then decrease to Claritin 10 mg twice a day.  If no symptoms for 7-14 days then decrease to Claritin 10 mg once a day.  May use Benadryl  (diphenhydramine ) as needed for breakthrough hives       If symptoms return, then step up dosage  Keep a detailed symptom journal including foods eaten, contact with allergens, medications taken, weather changes.  A set of labs has been ordered to help us  evaluate your hives. We will call you when the results become available  Call the clinic if this treatment plan is not working well for you  Follow up in 2 months or sooner if needed.

## 2024-08-25 NOTE — Progress Notes (Signed)
 "  [address] [place] [address] Dept: [phone]  FOLLOW UP NOTE  Patient ID: Janice Vance, female    DOB: [birth year]  Age: 34 y.o. MRN: [id number] Date of Office Visit: 08/25/2024  Assessment  Chief Complaint: Urticaria (Since Saturday )  HPI Janice Vance is a 34 year old female who presents to the clinic for evaluation of rash.  She was last seen in this clinic on 12/31/2022 by Dr. Kozlow for evaluation of urticaria and upper respiratory infection.  Discussed the use of AI scribe software for clinical note transcription with the patient, who gave verbal consent to proceed.  History of Present Illness Janice Vance is a 34 year old female who presents with recurrent hives.  She has been experiencing recurrent episodes of hives for the past five to six years, typically occurring two to three times a year. The hives initially appear on the buttocks, upper legs, and knees, later spreading to the abdomen and underarms. They are described as 'big spots' that are sore, itchy, and cause a sensation of being 'on fire'. The hives resolve on their own but reappear in different locations. She tries to avoid scratching or panicking, as it seems to worsen the condition.  She denies new medications, new foods, new personal care products, or recent insect stings.  She does report an episode with influenza with positive home testing on 08/05/2024 for which she took Tamiflu .  Otherwise, she reports no recent illness, fever, sweats, chills, or sick contacts.  She denies concomitant cardiopulmonary or gastrointestinal symptoms with these hives.  She reports that she began taking Claritin and famotidine with some relief of symptoms.  She continues Benadryl  as needed with moderate relief of symptoms. She has previously used oatmeal baths for relief. She has received steroid injections for her hives in the past, having had three last year, which she felt was excessive. During  episodes, her heart rate increases significantly, reaching up to 145 bpm when on steroids.  She reports experiencing hives and not knowing when she will experience hives  is significantly impacting her quality of life.  She is in agreement with moving forward with chronic urticaria panel lab testing at this time.  Her current medications are listed in the chart.  Drug Allergies:  Allergies[1]  Physical Exam: BP 92/60   Pulse 81   Temp 98.2 F (36.8 C) (Temporal)   Resp 16   Ht 5' 9 (1.753 m)   SpO2 98%   BMI 26.58 kg/m    Physical Exam Vitals reviewed.  Constitutional:      Appearance: Normal appearance.  HENT:     Head: Normocephalic and atraumatic.     Right Ear: Tympanic membrane normal.     Left Ear: Tympanic membrane normal.     Nose: Nose normal.     Mouth/Throat:     Pharynx: Oropharynx is clear.  Eyes:     Conjunctiva/sclera: Conjunctivae normal.  Cardiovascular:     Rate and Rhythm: Normal rate and regular rhythm.     Heart sounds: Normal heart sounds. No murmur heard. Pulmonary:     Effort: Pulmonary effort is normal.     Breath sounds: Normal breath sounds.     Comments: Lungs clear to auscultation Musculoskeletal:        General: Normal range of motion.     Cervical back: Normal range of motion and neck supple.  Skin:    General: Skin is warm and dry.  Neurological:     Mental  Status: She is alert and oriented to person, place, and time.  Psychiatric:        Mood and Affect: Mood normal.        Behavior: Behavior normal.        Thought Content: Thought content normal.        Judgment: Judgment normal.     Assessment and Plan: 1. Chronic urticaria      Patient Instructions  Hives (urticaria)  Claritin 10 mg twice a day and famotidine (Pepcid) 20 mg twice a day. If no symptoms for 7-14 days then decrease to Claritin 10 mg twice a day and famotidine (Pepcid) 20 mg once a day.  If no symptoms for 7-14 days then decrease to Claritin 10 mg twice  a day.  If no symptoms for 7-14 days then decrease to Claritin 10 mg once a day.  May use Benadryl  (diphenhydramine ) as needed for breakthrough hives       If symptoms return, then step up dosage  Keep a detailed symptom journal including foods eaten, contact with allergens, medications taken, weather changes.  A set of labs has been ordered to help us  evaluate your hives. We will call you when the results become available  Call the clinic if this treatment plan is not working well for you  Follow up in 2 months or sooner if needed.   Return in about 2 months (around 10/23/2024), or if symptoms worsen or fail to improve.    Thank you for the opportunity to care for this patient.  Please do not hesitate to contact me with questions.  Arlean Mutter, FNP [facility]          [1]  Allergies Allergen Reactions   Codeine Rash   Latex Rash   Penicillins Rash   "

## 2024-08-27 ENCOUNTER — Ambulatory Visit: Payer: Self-pay | Admitting: Rehabilitation

## 2024-08-29 LAB — CHRONIC URTICARIA (CU) EVAL
ALT: 12 IU/L (ref 0–32)
Basophils Absolute: 0 x10E3/uL (ref 0.0–0.2)
Basos: 0 %
CRP: <1 mg/L (ref 0–10)
EOS (ABSOLUTE): 0 x10E3/uL (ref 0.0–0.4)
Eos: 1 %
Hematocrit: 38.9 % (ref 34.0–46.6)
Hemoglobin: 12.9 g/dL (ref 11.1–15.9)
Immature Grans (Abs): 0 x10E3/uL (ref 0.0–0.1)
Immature Granulocytes: 0 %
Lymphocytes Absolute: 1.7 x10E3/uL (ref 0.7–3.1)
Lymphs: 37 %
MCH: 29.5 pg (ref 26.6–33.0)
MCHC: 33.2 g/dL (ref 31.5–35.7)
MCV: 89 fL (ref 79–97)
Monocytes Absolute: 0.3 x10E3/uL (ref 0.1–0.9)
Monocytes: 7 %
Neutrophils Absolute: 2.5 x10E3/uL (ref 1.4–7.0)
Neutrophils: 55 %
Platelets: 353 x10E3/uL (ref 150–450)
Pooled Donor- BAT CU: 5.4 % (ref 0.00–10.60)
RBC: 4.38 x10E6/uL (ref 3.77–5.28)
RDW: 12.1 % (ref 11.7–15.4)
Sed Rate: 9 mm/h (ref 0–32)
TSH: 1.02 u[IU]/mL (ref 0.450–4.500)
Thyroperoxidase Ab SerPl-aCnc: 15 [IU]/mL (ref 0–34)
WBC: 4.6 x10E3/uL (ref 3.4–10.8)

## 2024-08-31 ENCOUNTER — Encounter: Payer: Self-pay | Admitting: Hematology & Oncology

## 2024-08-31 ENCOUNTER — Ambulatory Visit: Payer: Self-pay | Admitting: Family Medicine

## 2024-08-31 ENCOUNTER — Encounter: Payer: Self-pay | Admitting: Physical Therapy

## 2024-08-31 ENCOUNTER — Ambulatory Visit: Payer: Self-pay | Admitting: Physical Therapy

## 2024-08-31 DIAGNOSIS — M542 Cervicalgia: Secondary | ICD-10-CM | POA: Diagnosis present

## 2024-08-31 DIAGNOSIS — M6281 Muscle weakness (generalized): Secondary | ICD-10-CM | POA: Diagnosis present

## 2024-08-31 DIAGNOSIS — R293 Abnormal posture: Secondary | ICD-10-CM

## 2024-08-31 NOTE — Progress Notes (Signed)
 Can you please let this patient know the urticaria panel is all within normal limits.  The panel looks at kidney function, thyroid  function, thyroid  antibodies, markers of inflammation, autoimmune urticaria, and blood cell counts. If she still having hives despite using high-dose antihistamines?  Thank you

## 2024-08-31 NOTE — Therapy (Signed)
 " OUTPATIENT PHYSICAL THERAPY CERVICAL EVALUATION   Patient Name: Janice Vance MRN: [id number] DOB:Mar 27, 1991, 34 y.o., female Today's Date: 08/31/2024   END OF SESSION:  PT End of Session - 08/31/24 0805     Visit Number 3    Date for Recertification  09/30/24    Authorization Type BCBS    PT Start Time 0805    PT Stop Time 0851    PT Time Calculation (min) 46 min    Activity Tolerance Patient tolerated treatment well    Behavior During Therapy WFL for tasks assessed/performed            Past Medical History:  Diagnosis Date   Anemia    Chicken pox    as a child   Frequent headaches    History of UTI    Hyperlipemia    Newborn product of IVF pregnancy    POTS (postural orthostatic tachycardia syndrome)    Past Surgical History:  Procedure Laterality Date   egg retrieval     IR RADIOLOGIST EVAL & MGMT  07/12/2023   IR RADIOLOGIST EVAL & MGMT  12/16/2023   Patient Active Problem List   Diagnosis Date Noted   Genetic testing 03/11/2024   Iron  deficiency anemia 12/25/2023   SVD (spontaneous vaginal delivery) 04/18/2021    PCP: Watt Harlene BROCKS, MD   REFERRING PROVIDER: Watt Harlene BROCKS, MD   REFERRING DIAG: [phone] (ICD-10-CM) - Spasm of left trapezius muscle  THERAPY DIAG:  Cervicalgia  Abnormal posture  Muscle weakness (generalized)  RATIONALE FOR EVALUATION AND TREATMENT: Rehabilitation  ONSET DATE: 3 months  NEXT MD VISIT:    SUBJECTIVE:                                                                                                                                                                                                         SUBJECTIVE STATEMENT: Patient reports the TPDN helped for a while. Interested in trying it again today.  Pain usually worse in the morning when she gets up and dies down during the day.  EVAL: 34 y/o referred to PT for L trapezius spasm.  States started noticing it about 3 months ago for no apparent  reason. Denies any trauma or change in activity level.  States the pain is in the L upper trap area and radiates up the L side of her neck to the base of the skull and mastoid process.  The pain is fairly constant of variable intensity.   Always feels some level of deep aching, but gets more of a  shooting pain with lifting activities.  She is L handed.    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   She has 2 children.   One is 39 month old and the other is a 36 y/o.  She does a lot of picking them up and carrying them.   States they recently went to [place] and that her L neck pain greatly increased from carrying the 82 month old.  Running, walking, driving, riding, picking up the children all increase her pain.     She is having pain with L rotation and R SB.  She has tried chiropractic and massage therapy but it didn't help her pain.   She was told she has a curve like a kyphosis.  She likes to run for exercises and does some half marathons, but hasn't been able to run due to the increased pain.   OF NOTE--she was having RUE numbness and tingling at one point in the past and had a brain MRI as well as EMG testing to r/o MS and other neuromuscular d/o.   All testing was negative.  She was found to have some thyroid  issues and iron  deficiency per her report.   Now that she has been treated for them her RUE issues have resolved per her report.  Hand dominance: Left  PAIN: Are you having pain? Yes: NPRS scale: 3/10  Pain location: L neck/UT Pain description: aching, shooting Aggravating factors: leaning to the L to pick up things, lying on L side Relieving factors: lying on R side avoiding offending activities like running, neck movements, lifting  PERTINENT HISTORY:  Anemia, POTS, h/o childhood chicken pox  PRECAUTIONS: None  RED FLAGS: None  HAND DOMINANCE:  Left  WEIGHT BEARING RESTRICTIONS: No  FALLS:  Has patient fallen in last 6 months? 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   CONTRAST:  7.5mL GADAVIST  GADOBUTROL  1 MMOL/ML IV SOLN   COMPARISON:  Head CT 07/02/2017.   FINDINGS: Brain:   Cerebral volume is normal.   There are two tiny nonspecific foci of T2 FLAIR hyperintense signal abnormality within the anterior right frontal lobe white matter (series 12, images 14 and 16).   No cortical encephalomalacia is identified.   There is no acute infarct.   No evidence of an intracranial mass.   No extra-axial fluid collection.   No midline shift.   No pathologic intracranial enhancement identified.   Vascular: Maintained flow voids within the proximal large arterial vessels.   Skull and upper cervical spine: No focal worrisome marrow lesion.   Sinuses/Orbits: No mass or acute finding within the imaged orbits. No significant paranasal sinus disease.   IMPRESSION: 1.  No evidence of an acute intracranial abnormality. 2. Two tiny nonspecific chronic insults within the anterior right frontal lobe white  matter. 3. Otherwise unremarkable MRI appearance of brain.  NCV & EMG Findings: Extensive electrodiagnostic testing of the right upper and lower extremity shows:  Right median, ulnar, mixed palmar, sural, and superficial peroneal  sensory responses are within normal limits. Right median, ulnar, peroneal, and tibial motor responses are within normal limits. Right tibial H reflex study is within normal limits. There is no evidence of active or chronic motor axonal loss changes affecting any of the tested muscles.     Impression: This is a normal study of the right upper and lower extremities.  In particular, there is no evidence of a large fiber sensorimotor polyneuropathy, cervical/lumbosacral radiculopathy, or carpal tunnel syndrome.CLINICAL DATA:  Numbness and tingling in the right arm.   EXAM: CERVICAL SPINE - COMPLETE 4+ VIEW   COMPARISON:  None Available.   FINDINGS: There is no evidence of cervical spine fracture or prevertebral soft tissue swelling. Alignment is normal. No other significant bone abnormalities are identified.   IMPRESSION: Negative cervical spine radiographs.     Electronically Signed   By: Camellia Candle M.D.   On: 12/18/2023 11:18  PATIENT SURVEYS:  NDI:  NECK DISABILITY INDEX  Date:  Score - 08/05/24  Pain intensity 1 = The pain is very mild at the moment  2. Personal care (washing, dressing, etc.) 0 = I can look after myself normally without causing extra pain  3. Lifting 4 =  I can only lift very light weights  4. Reading 0 = I can read as much as I want to with no pain in my neck  5. Headaches 1 =  I have slight headaches, which come infrequently  6. Concentration 1 =  I can concentrate fully when I want to with slight difficulty   7. Work 4 = I can hardly do any work at all  8. Driving 1 =  I can drive my car as long as I want with slight pain in my neck  9. Sleeping 1 = My sleep is slightly disturbed (less than 1 hr sleepless)  10. Recreation 1 =  I am able to engage in all my recreation activities, with some pain in my neck  Total 14/50   Minimum Detectable Change (90% confidence): 5 points or 10% points  COGNITION: Overall cognitive status: Within functional limits  for tasks assessed  SENSATION: WFL  POSTURE:  rounded shoulders and forward head;  moderate upper thoracic kyphosis   PALPATION: TTP over the L upper trap muscle belly with palpable trigger point.   Scalenes and SCM are tender but do not reproduce her pain like Tp palpation does.  Mastoid process is not significantly tender   CERVICAL ROM:   Active ROM A/PROM  eval  Flexion 100%  Extension 50%; stiff  Right lateral flexion 50%; painful  Left lateral flexion 75%; nonpainful  Right rotation 90%  Left rotation 50%; painful   (Blank rows = not tested)  UPPER EXTREMITY ROM:  Active ROM Right eval Left eval  Shoulder flexion 180 180  Shoulder extension    Shoulder abduction    Shoulder adduction    Shoulder internal rotation T6 T6  Shoulder external rotation T2 T2  Elbow flexion    Elbow extension    Wrist flexion    Wrist extension    Wrist ulnar deviation    Wrist radial deviation    Wrist pronation    Wrist supination     (Blank rows = not tested)  UPPER EXTREMITY MMT:  MMT  Right eval Left eval  Shoulder flexion 5 5  Shoulder extension    Shoulder abduction 5 4  Shoulder adduction    Shoulder internal rotation 5 5  Shoulder external rotation 5 4  Middle trapezius 4 4  Lower trapezius    Elbow flexion 5 5  Elbow extension 5 5  Wrist flexion 5 5  Wrist extension 5 4  Wrist ulnar deviation    Wrist radial deviation    Wrist pronation    Wrist supination    Grip strength =B =B  Finger abduction WNL WNL   (Blank rows = not tested)  CERVICAL SPECIAL TESTS:  Spurling's test: Negative and Distraction test: Negative  FUNCTIONAL TESTS:  N/a    TODAY'S TREATMENT:    08/31/2024  THERAPEUTIC EXERCISE: To improve strength, endurance, and flexibility.  Demonstration, verbal and tactile cues throughout for technique.  UBE: L2.0 x 3' each fwd & back Standing 3-way doorway pec stretch x 30 each  TRIGGER POINT DRY NEEDLING: Treatment  instructions/education: Subsequent Treatment: Instructions provided previously at initial dry needling treatment.  Instructions reviewed, if requested by the patient, prior to subsequent dry needling treatment.  Education Handout Provided: Previously Provided Consent: Patient Verbal Consent Given: Yes Treatment: Muscles Treated: B UT & LS, L pec major Utilized skilled palpation to identify bony landmarks and trigger points along with monitoring of soft tissue during DN. Electrical Stimulation Performed: No Treatment Response/Outcome: Twitch response elicited, Palpable increase in muscle length, Decreased tissue resistance noted, and Improved exercise tolerance  MANUAL THERAPY: To promote normalized muscle tension, improved flexibility, and reduced pain. STM/DTM, manual TPR and pin & stretch to muscles addressed with DN  NEUROMUSCULAR RE-EDUCATION: To improve strength, coordination, kinesthesia, and posture.  B scap retraction + GTB B shoulder horiz ABD x10 GTB B shoulder horiz ABD diagonals x 10 GTB B shoulder ER x 10   08/06/2024  THERAPEUTIC EXERCISE: To improve strength, endurance, ROM, and flexibility.  Demonstration, verbal and tactile cues throughout for technique.  UBE: L2.0 x 3' each fwd & back Seated L UT stretch x 30 Seated L UT stretch + slight overpressure x 30 Standing L UT stretch with arm held behind back 3 x 30 - preferred Seated L LS stretch 3 x 30 S/L L open book stretch 10 x 5  TRIGGER POINT DRY NEEDLING: Treatment instructions/education: Initial Treatment: Pt instructed on Dry Needling rational, procedures, and possible side effects. Pt instructed to expect mild to moderate muscle soreness later in the day and/or into the next day.  Pt instructed in methods to reduce muscle soreness. Pt instructed to continue prescribed HEP. Patient was educated on signs and symptoms of infection and other risk factors and advised to seek medical attention should they  occur.  Patient verbalized understanding of these instructions and education.  Education Handout Provided: Yes Consent: Patient Verbal Consent Given: Yes Treatment: Muscles Treated: L UT - multiple sticks Utilized skilled palpation to identify bony landmarks and trigger points along with monitoring of soft tissue during DN. Electrical Stimulation Performed: No Treatment Response/Outcome: Twitch response elicited, Palpable increase in muscle length, Decreased tissue resistance noted, and Decreased pain/TTP  MANUAL THERAPY: To promote normalized muscle tension, improved flexibility, increased ROM, and reduced pain. STM/DTM, manual TPR and pin & stretch to muscles addressed with DN   SELF CARE:   Provided education on what to expect following TPDN including soreness lasting up to 24 to 48 hours.  Encourage patient to increase hydration, drinking extra water today, and continue  with current HEP to help mitigate post TPDN soreness and encouraged normalization of muscle tension.  Patient made aware that she could use heat or ice as needed to address any muscle soreness from TPDN.   08/05/24 SELF CARE: Provided education on PT POC progression, initial HEP  (Patient is dressed in nice work clothing so is unable to do any manual therapy, US /ESU combo treatment, taping, dry needling etc.    She will dress in gym clothes next visit)   PATIENT EDUCATION:  Education details: HEP update - UT/LS and open book stretches, postural awareness, role of TPDN, TPDN rational, procedure, outcomes, potential side effects, and recommended post-treatment exercises/activity, and fee schedule for TPDN and lack of insurance coverage requiring payment at time of service  Person educated: Patient Education method: Explanation, Demonstration, Verbal cues, Tactile cues, Handouts, and MedBridgeGO app updated Education comprehension: verbalized understanding, verbal cues required, tactile cues required, and needs further  education  HOME EXERCISE PROGRAM: Access Code: [email or web address] URL: [email or web address] Date: 08/31/2024 Prepared by: Elijah Hidden  Exercises - Standing Cervical Retraction  - 1 x daily - 7 x weekly - 3 sets - 10 reps - Seated Cervical Retraction and Extension  - 1 x daily - 7 x weekly - 3 sets - 10 reps - Cervical Retraction Prone on Elbows  - 1 x daily - 7 x weekly - 3 sets - 10 reps - Shoulder External Rotation and Scapular Retraction with Resistance  - 1 x daily - 7 x weekly - 3 sets - 10 reps - Standing Shoulder Row with Anchored Resistance  - 1 x daily - 7 x weekly - 3 sets - 10 reps - Upper Trapezius Stretch (Mirrored)  - 2-3 x daily - 7 x weekly - 3 reps - 30 sec hold - Gentle Levator Scapulae Stretch  - 2-3 x daily - 7 x weekly - 3 reps - 30 sec hold - Sidelying Thoracic Rotation with Open Book  - 1 x daily - 7 x weekly - 2-3 sets - 10 reps - 5 sec hold - Doorway Pec Stretch at 90 Degrees Abduction  - 2-3 x daily - 7 x weekly - 3 reps - 30 sec hold - Standing Shoulder Horizontal Abduction with Resistance  - 1 x daily - 7 x weekly - 2 sets - 10 reps - 3 sec hold - Standing Shoulder Diagonal Horizontal Abduction 60/120 Degrees with Resistance  - 1 x daily - 7 x weekly - 2 sets - 10 reps - 3 sec hold   ASSESSMENT:  CLINICAL IMPRESSION: Tilley reports some benefit from initial TPDN session but not lasting relief.  She expressed interested in trying TPDN again today, therefore addressed abnormal muscle tension in B UT and LS and well as L pecs.  Reviewed post-TPDN expectations and recommendations.    She admits to poor postural awareness and continues to demonstrate a very forward rounded posture, therefore added stretches and strengthening targeting chest opening and postural muscle engagement/strengthening to help facilitate improved posture for further normalization of muscle tension.  [place] will benefit from continued skilled PT to address ongoing abnormal muscle tension,  postural and strength deficits to improve mobility and activity tolerance with decreased pain interference.    EVAL: Breeann Reposa is a 34 y.o. female who was referred to physical therapy for evaluation and treatment for L upper trapezius strain.   Patient reports onset of L sided neck pain beginning a few months ago for no apparent reason. Pain is worse with  lifting/carrying her children or doing any lifting in general especially when leaning to the L.   She is L handed.   She has tried chiropractic and massage therapy without relief.   She has a palpable L UT trigger point and deep pressure to this reproduces her pain.  She also has trigger points in the middle trap/rhomboid area along the medial scapular border, but they are not the source of her pain.   R cervical sidebending and L rotation increase her pain.   Compression and distraction tests do not seem to make any change in her symptoms.   She denies any radiating pain into the LUE below shoulder level.   RTC testing is negative.   She is an excellent candidate for PT and especially dry needling as her pain seems to be more myofascial in nature.   Her upper thoracic kyphosis and postural deficits are likely precipitating factors for the pain.  Patient has deficits in cervical ROM, postural muscle/middle trap strength, abnormal posture, and TTP with abnormal muscle tension  which are interfering with ADLs and are impacting quality of life.  On NDI patient scored 14/50 demonstrating 18% or mild disability.  [place] will benefit from skilled PT to address above deficits to improve mobility and activity tolerance with decreased pain interference.  OBJECTIVE IMPAIRMENTS: decreased strength, postural dysfunction, and pain.   ACTIVITY LIMITATIONS: carrying, lifting, and running  PARTICIPATION LIMITATIONS: cleaning, laundry, driving, and occupation  PERSONAL FACTORS: Time since onset of injury/illness/exacerbation and 1-2 comorbidities: anemia,  POTS, h/o childhood chicken pox are also affecting patient's functional outcome.   REHAB POTENTIAL: Good  CLINICAL DECISION MAKING: Evolving/moderate complexity  EVALUATION COMPLEXITY: Moderate   GOALS: Goals reviewed with patient? Yes  SHORT TERM GOALS: Target date: 09/02/2024   Patient will be independent with initial HEP to improve outcomes and carryover.  Baseline: 100% PT assist required for correct completion 08/06/24 - in progress  Goal status: MET - 08/31/24  2.  Patient will report 25% improvement in neck pain to improve QOL.  Baseline: 7/10 worst Goal status: IN PROGRESS - 08/31/24 - 10% improvement  3.  Patient will demonstrate improved strength by 1/2 grade in her middle trap and shoulder external rotators Baseline: see strength tables above Goal status: INITIAL   LONG TERM GOALS: Target date: 09/30/2024   Patient will be independent with ongoing/advanced HEP for self-management at home.  Baseline: no advanced HEP yet Goal status: IN PROGRESS - 08/31/24 - HEP updated to progress postural stretching and strengthening  2.  Patient will demonstrate improved posture to decrease muscle imbalance. Baseline: upper thoracic kyphosis and forward head posture Goal status: INITIAL  3.  Patient will report 50-75% improvement in neck pain to improve QOL.  Baseline: 7/10 worst Goal status: INITIAL  5.  Patient will demonstrate full pain free cervical ROM for safety with driving.  Baseline: Refer to above cervical ROM table Goal status: INITIAL  6.  Patient will report </= 18% on NDI (MCID = 10%) to demonstrate improved functional ability.  Baseline: 28% Goal status: INITIAL  7.  Patient will demonstrate 4+ to 5/5 strength in her posterior scapular/shoulder musculature   Baseline: see strength tables above Goal status: INITIAL   PLAN:  PT FREQUENCY: 1-2x/week  PT DURATION: 8 weeks  PLANNED INTERVENTIONS: 97110-Therapeutic exercises, 97530- Therapeutic activity,  [id number]- Neuromuscular re-education, 97535- Self Care, 02859- Manual therapy, [id number]- Electrical stimulation (unattended), [id number]- Ultrasound, 02987- Traction (mechanical), [id number]- Ionotophoresis 4mg /ml Dexamethasone , 79439 (1-2 muscles), 20561 (3+ muscles)- Dry  Needling, Patient/Family education, Taping, Joint mobilization, and Spinal mobilization  PLAN FOR NEXT SESSION: Assess response to TPDN, further TPDN to L/R UT, LS and pecs as indicated and benefit noted, posterior/postural muscle strengthening, foam roll stretching and exercises, check vertebral mobility/PA glides; modalities and KT tape PRN   Loisann Roach M [place], PT 08/31/2024, 9:39 AM      "

## 2024-09-03 ENCOUNTER — Telehealth: Payer: Self-pay | Admitting: Physical Therapy

## 2024-09-03 ENCOUNTER — Ambulatory Visit: Payer: Self-pay | Admitting: Physical Therapy

## 2024-09-03 NOTE — Telephone Encounter (Signed)
 PT called Janice Vance regarding no show for today's appointment at 8:00.  Message left informing her that today was her last scheduled appointment and that she will need to call back to schedule a new appointment.  Patient also reminded of our cancellation and no show policy which requires us  to reduce her ability to schedule appointments to one visit at a time as this was her 2nd no show.  Elijah EMERSON Hidden, PT 09/03/2024, 8:18 AM  [facility] [address]  [address] [place], [place], [address] Phone: [phone]   Fax:  [phone]

## 2024-09-03 NOTE — Progress Notes (Signed)
 Can you please have her continue to follow with the antihistamine ladder. Please send out information about Xolair for hives. Please have her call the clinic if she is interested in this option. Thank you

## 2024-09-23 DIAGNOSIS — E785 Hyperlipidemia, unspecified: Secondary | ICD-10-CM | POA: Insufficient documentation

## 2024-09-23 DIAGNOSIS — D649 Anemia, unspecified: Secondary | ICD-10-CM | POA: Insufficient documentation

## 2024-09-23 DIAGNOSIS — G90A Postural orthostatic tachycardia syndrome (POTS): Secondary | ICD-10-CM | POA: Insufficient documentation

## 2024-09-23 DIAGNOSIS — R519 Headache, unspecified: Secondary | ICD-10-CM | POA: Insufficient documentation

## 2024-09-23 DIAGNOSIS — Z87440 Personal history of urinary (tract) infections: Secondary | ICD-10-CM | POA: Insufficient documentation

## 2024-09-23 DIAGNOSIS — B019 Varicella without complication: Secondary | ICD-10-CM | POA: Insufficient documentation

## 2024-09-23 NOTE — Progress Notes (Unsigned)
 " Cardiology Office Note:    Date:  09/24/2024   ID:  Janice Vance, Janice Vance 13-Apr-1991, MRN [id number]  PCP:  Janice Harlene BROCKS, MD  Cardiologist:  Janice Leiter, MD   Referring MD: Janice Harlene BROCKS, MD  ASSESSMENT:    1. Chest pain of uncertain etiology   2. Palpitations    PLAN:    In order of problems listed above:  Her primary concern is pleuritic chest pain chronic has been lasting for months interesting occurring at the same time as her radicular or shoulder pain. Multiple other problems including chronic recurrent urticaria neuropathic symptoms palpitation shortness of breath exercise intolerance Further evaluation echocardiogram for pericardial disease check inflammatory markers CRP sed rate ANA and ENA markers. 2-week ZIO monitor for ongoing symptoms of palpitation If all is unremarkable I think she would benefit from seeing a rheumatologist she clearly is having multisystem complaints without a diagnosis She asked me to check LP(a) and APO B with her family history of premature CAD  Next appointment with me 3 months   Medication Adjustments/Labs and Tests Ordered: Current medicines are reviewed at length with the patient today.  Concerns regarding medicines are outlined above.  Orders Placed This Encounter  Procedures   EKG 12-Lead   No orders of the defined types were placed in this encounter.    No chief complaint on file.   History of Present Illness:    Janice Vance is a 34 y.o. female who is being seen today for the evaluation of rapid heart rate at the request of Janice Vance, Harlene BROCKS, MD.  She had seen Janice. Inocencio EP previously for rapid heartbeat felt to be due to autonomic dysfunction including the potential of POTS syndrome. An echocardiogram showed a structurally normal heart normal left ventricular systolic diastolic function no chamber enlargement and no significant valvular abnormality.  She had an event monitor that showed symptoms  associated with sinus tachycardia.  She sees me today predominantly because her concern is that she has had several months of pleuritic chest pain left side does not radiate to the back she is having chronic shoulder pain also and is not postural. She does not have fever chills or cough Despite iron  deficiency being addressed she finds exercise intolerance is diminished and she is short of breath when she tries to exercise and aware of her heart beating rapidly Other problems have included shoulder pain she has had neuropathic symptoms in her lower extremities she has seen neurology she has had an MRI done she has had nerve conduction studies and still has numbness in the right leg. She is concerned she may have POTS She has had no inflammatory arthritis photosensitivity or rash. I searched in the chart it does not look like she has been screened for rheumatologic disorder. Her father has a history of premature CAD.  She has mild hyperlipidemia She is not having edema orthopnea neck vein distention exertional angina or syncope Past Medical History:  Diagnosis Date   Anemia    Chicken pox    as a child   Frequent headaches    Genetic testing 03/11/2024   Negative genetic testing. No pathogenic variants identified on the [facility] CancerNext-Expanded+RNA Panel.  VUS in POLE called [id number] identified. The report date is 03/08/2024.     The CancerNext-Expanded gene panel offered by [facility] and includes sequencing, rearrangement, and RNA analysis for the following 77 genes: AIP, ALK, APC, ATM, AXIN2, BAP1, BARD1, BMPR1A, BRCA1, BRCA2, BRIP1, CDC73,  History of UTI    Hyperlipemia    Iron  deficiency anemia 12/25/2023   Newborn product of IVF pregnancy    POTS (postural orthostatic tachycardia syndrome)    SVD (spontaneous vaginal delivery) 04/18/2021    Past Surgical History:  Procedure Laterality Date   egg retrieval     IR RADIOLOGIST EVAL & MGMT  07/12/2023   IR RADIOLOGIST EVAL &  MGMT  12/16/2023    Current Medications: Active Medications[1]   Allergies:   Codeine, Latex, and Penicillins   Social History   Socioeconomic History   Marital status: Married    Spouse name: Not on file   Number of children: Not on file   Years of education: Not on file   Highest education level: Not on file  Occupational History   Not on file  Tobacco Use   Smoking status: Never   Smokeless tobacco: Never  Vaping Use   Vaping status: Never Used  Substance and Sexual Activity   Alcohol use: No   Drug use: No   Sexual activity: Yes  Other Topics Concern   Not on file  Social History Narrative   Are you right handed or left handed? Left    Are you currently employed ? 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 no   Who lives with you? Family    What type of home do you live in: 1 story or 2 story? 1 story with bonus room         Social Drivers of Health   Tobacco Use: Low Risk (09/24/2024)   Patient History    Smoking Tobacco Use: Never    Smokeless Tobacco Use: Never    Passive Exposure: Not on file  Financial Resource Strain: Not on file  Food Insecurity: No Food Insecurity (12/25/2023)   Hunger Vital Sign    Worried About Running Out of Food in the Last Year: Never true    Ran Out of Food in the Last Year: Never true  Transportation Needs: No Transportation Needs (12/25/2023)   PRAPARE - Administrator, Civil Service (Medical): No    Lack of Transportation (Non-Medical): No  Physical Activity: Not on file  Stress: Not on file  Social Connections: Unknown (01/02/2022)   Received from [facility]   Social Network    Social Network: Not on file  Depression (PHQ2-9): Low Risk (07/06/2024)   Depression (PHQ2-9)    PHQ-2 Score: 0  Alcohol Screen: Not on file  Housing: Low Risk (12/25/2023)   Housing Stability Vital Sign    Unable to Pay for Housing in the Last Year: No    Number of Times Moved in the  Last Year: 0    Homeless in the Last Year: No  Utilities: Not At Risk (12/25/2023)   AHC Utilities    Threatened with loss of utilities: No  Health Literacy: Not on file     Family History: The patient's family history includes Asthma in her paternal grandmother; Breast cancer (age of onset: 26) in her father; Breast cancer (age of onset: 71 - 2) in her paternal grandmother; Breast cancer (age of onset: 63 - 12) in her maternal grandmother; Colon cancer (age of onset: 13 - 23) in her maternal aunt; Diabetes in her father; Heart attack in her paternal grandfather and paternal uncle; Heart failure in her maternal grandmother; Melanoma in her maternal great-grandmother; Melanoma (age of onset:  20) in her paternal aunt; Supraventricular tachycardia in her maternal grandmother and paternal grandmother; Thyroid  cancer (age of onset: 79 - 40) in her paternal great-grandmother.  ROS:   ROS Please see the history of present illness.     All other systems reviewed and are negative.  EKGs/Labs/Other Studies Reviewed:    The following studies were reviewed today:   EKG Interpretation Date/Time:  Thursday September 24 2024 09:41:34 EST Ventricular Rate:  77 PR Interval:  156 QRS Duration:  92 QT Interval:  382 QTC Calculation: 432 R Axis:   96  Text Interpretation: Normal sinus rhythm with sinus arrhythmia Rightward axis No previous ECGs available Confirmed by Monetta Rogue ([id number]) on 09/24/2024 9:42:46 AM   Recent Labs: 07/06/2024: BUN 7; Creatinine, Ser 0.46; Potassium 4.1; Sodium 140 08/25/2024: ALT 12; Hemoglobin 12.9; Platelets 353; TSH 1.020  Recent Lipid Panel    Component Value Date/Time   CHOL 189 07/06/2024 1147   TRIG 143 07/06/2024 1147   HDL 40 (L) 07/06/2024 1147   CHOLHDL 4.7 07/06/2024 1147   VLDL 42.8 (H) 07/04/2023 1322   LDLCALC 123 (H) 07/06/2024 1147   LDLDIRECT 131.0 07/19/2021 1511    Physical Exam:    VS:  BP 106/74   Pulse 77   Ht 5' 10 (1.778 m)   Wt 178 lb  6 oz (80.9 kg)   SpO2 98%   BMI 25.59 kg/m     Wt Readings from Last 3 Encounters:  09/24/24 178 lb 6 oz (80.9 kg)  07/06/24 180 lb (81.6 kg)  05/26/24 181 lb 6.4 oz (82.3 kg)     GEN: Appears healthy no rash well nourished, well developed in no acute distress HEENT: Normal NECK: No JVD; No carotid bruits LYMPHATICS: No lymphadenopathy CARDIAC: No chest wall tenderness no cardiac rub RRR,  RESPIRATORY:  Clear to auscultation without rales, wheezing or rhonchi  ABDOMEN: Soft, non-tender, non-distended MUSCULOSKELETAL:  No edema; No deformity  SKIN: Warm and dry NEUROLOGIC:  Alert and oriented x 3 PSYCHIATRIC:  Normal affect     Signed, Rogue Monetta, MD  09/24/2024 10:46 AM    [facility]     [1]  Current Meds  Medication Sig   Multiple Vitamin (MULTIVITAMIN) capsule Take 1 capsule by mouth daily.   "

## 2024-09-24 ENCOUNTER — Ambulatory Visit: Admitting: Cardiology

## 2024-09-24 ENCOUNTER — Encounter: Payer: Self-pay | Admitting: Cardiology

## 2024-09-24 ENCOUNTER — Ambulatory Visit

## 2024-09-24 VITALS — BP 106/74 | HR 77 | Ht 70.0 in | Wt 178.4 lb

## 2024-09-24 DIAGNOSIS — R002 Palpitations: Secondary | ICD-10-CM

## 2024-09-24 DIAGNOSIS — R079 Chest pain, unspecified: Secondary | ICD-10-CM

## 2024-09-24 NOTE — Patient Instructions (Signed)
 Medication Instructions:  Your physician recommends that you continue on your current medications as directed. Please refer to the Current Medication list given to you today.  *If you need a refill on your cardiac medications before your next appointment, please call your pharmacy*  Lab Work: Your physician recommends that you return for lab work in:   Labs today: Sed rate, CRP, ANA, Anti-ENA6 plus DFS70Ab profile, Lpa, Apo B  If you have labs (blood work) drawn today and your tests are completely normal, you will receive your results only by: MyChart Message (if you have MyChart) OR A paper copy in the mail If you have any lab test that is abnormal or we need to change your treatment, we will call you to review the results.  Testing/Procedures: A zio monitor was ordered today. It will remain on for 14 days. You will then return monitor and event diary in provided box. It takes 1-2 weeks for report to be downloaded and returned to us . We will call you with the results. If monitor falls off or has orange flashing light, please call Zio for further instructions.   Your physician has requested that you have an echocardiogram. Echocardiography is a painless test that uses sound waves to create images of your heart. It provides your doctor with information about the size and shape of your heart and how well your hearts chambers and valves are working. This procedure takes approximately one hour. There are no restrictions for this procedure. Please do NOT wear cologne, perfume, aftershave, or lotions (deodorant is allowed). Please arrive 15 minutes prior to your appointment time.  Please note: We ask at that you not bring children with you during ultrasound (echo/ vascular) testing. Due to room size and safety concerns, children are not allowed in the ultrasound rooms during exams. Our front office staff cannot provide observation of children in our lobby area while testing is being conducted. An  adult accompanying a patient to their appointment will only be allowed in the ultrasound room at the discretion of the ultrasound technician under special circumstances. We apologize for any inconvenience.   Follow-Up: At [facility], you and your health needs are our priority.  As part of our continuing mission to provide you with exceptional heart care, our providers are all part of one team.  This team includes your primary Cardiologist (physician) and Advanced Practice Providers or APPs (Physician Assistants and Nurse Practitioners) who all work together to provide you with the care you need, when you need it.  Your next appointment:   6 week(s)  Provider:   Redell Leiter, MD    We recommend signing up for the patient portal called MyChart.  Sign up information is provided on this After Visit Summary.  MyChart is used to connect with patients for Virtual Visits (Telemedicine).  Patients are able to view lab/test results, encounter notes, upcoming appointments, etc.  Non-urgent messages can be sent to your provider as well.   To learn more about what you can do with MyChart, go to [email or web address].   Other Instructions None

## 2024-10-22 ENCOUNTER — Ambulatory Visit

## 2024-11-05 ENCOUNTER — Ambulatory Visit: Admitting: Cardiology

## 2024-11-24 ENCOUNTER — Other Ambulatory Visit

## 2024-11-24 ENCOUNTER — Ambulatory Visit: Admitting: Hematology and Oncology
